# Patient Record
Sex: Female | Born: 1960 | Race: Black or African American | Hispanic: No | Marital: Single | Smoking: Never smoker
Health system: Southern US, Community
[De-identification: ages and names within clinical notes are randomized; demographics above are authoritative.]

## PROBLEM LIST (undated history)

## (undated) DIAGNOSIS — K529 Noninfective gastroenteritis and colitis, unspecified: Secondary | ICD-10-CM

## (undated) DIAGNOSIS — M5430 Sciatica, unspecified side: Secondary | ICD-10-CM

## (undated) DIAGNOSIS — M1990 Unspecified osteoarthritis, unspecified site: Secondary | ICD-10-CM

## (undated) DIAGNOSIS — M5030 Other cervical disc degeneration, unspecified cervical region: Secondary | ICD-10-CM

## (undated) DIAGNOSIS — R200 Anesthesia of skin: Secondary | ICD-10-CM

## (undated) DIAGNOSIS — F419 Anxiety disorder, unspecified: Secondary | ICD-10-CM

## (undated) DIAGNOSIS — T7840XA Allergy, unspecified, initial encounter: Secondary | ICD-10-CM

## (undated) HISTORY — DX: Unspecified osteoarthritis, unspecified site: M19.90

## (undated) HISTORY — DX: Anxiety disorder, unspecified: F41.9

## (undated) HISTORY — DX: Allergy, unspecified, initial encounter: T78.40XA

## (undated) HISTORY — DX: Anesthesia of skin: R20.0

## (undated) HISTORY — PX: ABDOMINAL HYSTERECTOMY: SHX81

---

## 2002-03-28 ENCOUNTER — Inpatient Hospital Stay (HOSPITAL_COMMUNITY): Admission: AD | Admit: 2002-03-28 | Discharge: 2002-03-28 | Payer: Self-pay | Admitting: *Deleted

## 2002-10-07 ENCOUNTER — Inpatient Hospital Stay (HOSPITAL_COMMUNITY): Admission: AD | Admit: 2002-10-07 | Discharge: 2002-10-07 | Payer: Self-pay | Admitting: *Deleted

## 2003-06-17 ENCOUNTER — Encounter (INDEPENDENT_AMBULATORY_CARE_PROVIDER_SITE_OTHER): Payer: Self-pay | Admitting: Specialist

## 2003-06-17 ENCOUNTER — Ambulatory Visit (HOSPITAL_COMMUNITY): Admission: RE | Admit: 2003-06-17 | Discharge: 2003-06-17 | Payer: Self-pay | Admitting: Obstetrics and Gynecology

## 2004-01-19 ENCOUNTER — Inpatient Hospital Stay (HOSPITAL_COMMUNITY): Admission: RE | Admit: 2004-01-19 | Discharge: 2004-01-21 | Payer: Self-pay | Admitting: Obstetrics and Gynecology

## 2004-01-19 ENCOUNTER — Encounter (INDEPENDENT_AMBULATORY_CARE_PROVIDER_SITE_OTHER): Payer: Self-pay | Admitting: Specialist

## 2005-04-24 ENCOUNTER — Ambulatory Visit (HOSPITAL_COMMUNITY): Admission: RE | Admit: 2005-04-24 | Discharge: 2005-04-24 | Payer: Self-pay | Admitting: Gastroenterology

## 2005-08-28 ENCOUNTER — Other Ambulatory Visit: Admission: RE | Admit: 2005-08-28 | Discharge: 2005-08-28 | Payer: Self-pay | Admitting: Obstetrics and Gynecology

## 2005-11-15 ENCOUNTER — Emergency Department (HOSPITAL_COMMUNITY): Admission: EM | Admit: 2005-11-15 | Discharge: 2005-11-15 | Payer: Self-pay | Admitting: Emergency Medicine

## 2010-05-25 ENCOUNTER — Encounter: Admission: RE | Admit: 2010-05-25 | Discharge: 2010-05-25 | Payer: Self-pay | Admitting: Gastroenterology

## 2010-12-11 ENCOUNTER — Encounter: Payer: Self-pay | Admitting: Gastroenterology

## 2010-12-11 ENCOUNTER — Encounter: Payer: Self-pay | Admitting: Obstetrics and Gynecology

## 2011-04-07 NOTE — Op Note (Signed)
   NAMEAnderson Reid NO.:  [id number]   MEDICAL RECORD NO.:  [id number]                   PATIENT TYPE:  AMB   LOCATION:  SDC                                  FACILITY:  [facility]   PHYSICIAN:  Duke Salvia. Marcelle Reid, M.D.            DATE OF BIRTH:  [birth year]   DATE OF PROCEDURE:  06/17/2003  DATE OF DISCHARGE:                                 OPERATIVE REPORT   PREOPERATIVE DIAGNOSIS:  Abnormal uterine bleeding, small leiomyoma, and  endometrial polyp on SHG.   POSTOPERATIVE DIAGNOSIS:  Abnormal uterine bleeding, small leiomyoma, and  endometrial polyp on SHG.   PROCEDURE:  Dilation and curettage, hysteroscopy.   SURGEON:  Duke Salvia. Marcelle Reid, M.D.   ANESTHESIA:  General.   PROCEDURE AND FINDINGS:  The patient was taken to the operating room.  After  an adequate level of general anesthetic was obtained with the patient's legs  in the stirrups, the perineum and vagina were prepped and draped with  Betadine.  The bladder was drained.  EUA was carried out.  The uterus was  eight weeks size, mid position, and mobile.  Adnexa negative.  A speculum  was positioned, cervix grasped with a tenaculum.  A paracervical block was  created by infiltrating at 3 and 9 o'clock 5-7 mL of 1% Xylocaine on either  side after negative aspiration.  This was done to help with postop cramping  pain.  After this was accomplished, the uterus was sounded to 9 cm, the  mucosa dilated to a 29 Pratt.  The 7 mm continuous flow hysteroscope was  then inserted.  A large amount of tissue including some apparent polypoid  tissue was noted.  D&C was carried out removing a moderate amount of tissue.  Polyp forceps were used in the cavity and two polyps were avulsed.  The  scope was reinserted.  Good view was noted at that point with some residual  tissue along the left cornua and left pelvic sidewall.  Repeat D&C removed  this tissue.  The cavity was irrigated, hysteroscoped again, and  noted to be  clear.  There were no other abnormalities noted, minimal bleeding.  She  tolerated this well and went to the recovery room in good condition.                                               Debra Reid, M.D.    RMH/MEDQ  D:  06/17/2003  T:  06/17/2003  Job:  [id number]

## 2011-04-07 NOTE — Discharge Summary (Signed)
Debra Reid, Debra Reid                         ACCOUNT NO.:  [id number]   MEDICAL RECORD NO.:  [id number]                   PATIENT TYPE:  INP   LOCATION:  9309                                 FACILITY:  [facility]   PHYSICIAN:  Duke Salvia. Marcelle Overlie, M.D.            DATE OF BIRTH:  1961/09/13   DATE OF ADMISSION:  01/19/2004  DATE OF DISCHARGE:  01/21/2004                                 DISCHARGE SUMMARY   DISCHARGE DIAGNOSES:  1. Chronic pelvic pain.  2. Leiomyoma.  3. Laparoscopy this admission followed by laparotomy with total abdominal     hysterectomy/bilateral salpingo-oophorectomy.   SUMMARY OF HISTORY AND PHYSICAL EXAMINATION:  Please see admission H&P for  details.  Briefly, a 50 year old, G1, P1, with known leiomyoma and  complaints of menorrhagia and pelvic pain, who presents for a definitive  hysterectomy.   HOSPITAL COURSE:  On January 19, 2004, under general anesthesia, the patient  underwent diagnostic laparoscopy that showed dense bilateral adnexal  adhesions, followed by TAH/BSO.  Some changes of chronic PID with bilateral  adnexal adhesions noted at the time of surgery.  On the first postoperative  day, her hemoglobin was 9.5.  She was afebrile with a WBC of 10.7, and her  diet was advanced.  By the second postoperative day, she was discharged at  1:30 p.m.  At that time, she was tolerating a regular diet, the incision was  clean and dry, her abdominal exam was unremarkable, and she was afebrile and  ready for discharge at that point.   LABORATORY DATA:  CMET on admission was normal.  Hemoglobin on admission was  12.1, WBC 4.6.  GPT was negative.  Blood type O positive.  Antibody screen  was negative.  UA was negative.  Postoperative CBC on January 20, 2004 was 9.5  with a WBC of 10.7.  Pathology report is pending.   FOLLOW UP:  She will return to the office in 3 days to have the clips  removed.   DISCHARGE INSTRUCTIONS:  1. Advised to report any incisional redness or  drainage, increased pain or     bleeding, or fever over 101.  2. She was given specific instructions regarding diet, sex, and exercise.   DISCHARGE MEDICATIONS:  1. The patient is discharged on Tylox p.r.n. pain.  2. Also will start on hemocyte once daily.   CONDITION:  Good.   ACTIVITY:  Increase as tolerated.                                               Richard M. Marcelle Overlie, M.D.   RMH/MEDQ  D:  01/21/2004  T:  01/22/2004  Job:  [id number]

## 2011-04-07 NOTE — Op Note (Signed)
Debra Reid, ABRAMOVICH          ACCOUNT NO.:  [id number]   MEDICAL RECORD NO.:  [id number]          PATIENT TYPE:  AMB   LOCATION:  ENDO                         FACILITY:  [facility]   PHYSICIAN:  Graylin Shiver, M.D.   DATE OF BIRTH:  Jan 13, 1961   DATE OF PROCEDURE:  04/24/2005  DATE OF DISCHARGE:                                 OPERATIVE REPORT   PROCEDURE:  Colonoscopy.   ENDOSCOPIST:  Graylin Shiver, M.D.   INDICATIONS:  Constipation and rectal bleeding.   Informed consent was obtained after explanation of the risks of bleeding,  infection, and perforation.   PREMEDICATION:  Fentanyl 75 mcg IV, Versed 8 mg IV.   DESCRIPTION OF PROCEDURE:  With the patient in the left lateral decubitus  position, a rectal exam was performed. No masses were felt. The Olympus  colonoscope was inserted into the rectum and advanced around the colon to  the cecum. Cecal landmarks were identified.  The cecum and ascending colon  were normal. The transverse colon was normal. The descending colon, sigmoid,  and rectum were normal. She tolerated the procedure well without  complications.   IMPRESSION:  Normal colonoscopy to cecum.   I am going to give this patient a therapeutic trial of Zelnorm 6 mg b.i.d.  for her problem pf constipation to see if this helps.      SFG/MEDQ  D:  04/24/2005  T:  04/24/2005  Job:  [id number]   cc:   Duke Salvia. Marcelle Overlie, M.D.  [address], [address]  [place] [address]  Fax: [phone]

## 2011-04-07 NOTE — Op Note (Signed)
NAMEAnderson Reid NO.:  [id number]   MEDICAL RECORD NO.:  [id number]                   PATIENT TYPE:   LOCATION:                                       FACILITY:   PHYSICIAN:  Duke Salvia. Marcelle Overlie, M.D.            DATE OF BIRTH:   DATE OF PROCEDURE:  01/19/2004  DATE OF DISCHARGE:                                 OPERATIVE REPORT   PREOPERATIVE DIAGNOSES:  1. Chronic pelvic pain.  2. Leiomyoma.   POSTOPERATIVE DIAGNOSES:  1. Chronic pelvic pain.  2. Leiomyoma.  3. Bilateral adnexal adhesions/changes of chronic pelvic inflammatory     disease.   PROCEDURE:  Diagnostic laparoscopy, followed by laparotomy with total  abdominal hysterectomy, bilateral salpingo-oophorectomy, lysis of adhesions.   SURGEON:  Duke Salvia. Marcelle Overlie, M.D.   ASSISTANT:  Dineen Kid. Rana Snare, M.D.   ANESTHESIA:  General endotracheal.   COMPLICATIONS:  None.   DRAINS:  Foley catheter.   ESTIMATED BLOOD LOSS:  350.   SPECIMENS REMOVED:  Uterus, bilateral tubes and ovaries.   PROCEDURE AND FINDINGS:  The patient went to the operating room.  After an  adequate level of general endotracheal anesthesia was obtained with the  patient's legs in stirrups, the abdomen, perineum, and vagina were prepped  and draped in the usual manner for laparoscopy.  The bladder was drained,  EUA carried out.  The uterus was eight weeks' size, midposition, mobile,  adnexa negative.  Hulka tenaculum was positioned.  Attention directed to the  abdomen, where a 2 cm subumbilical incision was made.  The Veress needle was  introduced without difficulty.  Its intra-abdominal position was verified by  pressure and water testing.  After a 2 L pneumoperitoneum was then created,  the laparoscopic trocar and sleeve were then introduced without difficulty.  Three fingerbreadths above the symphysis in the midline a 5 mm trocar was  inserted under direct visualization.  The patient then placed in  Trendelenburg  and the pelvic findings were as follows:  The uterus itself  was enlarged symmetrically to eight to 10 weeks' size.  There were dense  bilateral adnexal adhesions, making laparoscopic dissection very difficult.  A decision made to proceed with open laparotomy.  Instruments were removed,  gas allowed to escape.  The subumbilical incision was closed with a 4-0  subcuticular suture.  The patient's legs were placed supine after the Foley  catheter positioned.  A Pfannenstiel incision made two fingerbreadths above  the symphysis, carried down to the fascia, which was incised and extended  transversely.  Rectus muscles were divided in the midline, peritoneum  entered freely without incident and extended vertically.  The Lenox Ahr retractor was then positioned.  The upper abdominal findings were  unremarkable.  The appendix was noted to be unremarkable.  The bowels were  packed superiorly out of the field.  The patient was placed in slight  Trendelenburg.  Starting on the left, initially  the ovaries could not be  visualized well enough to free up, a decision made to proceed with the TAH,  followed by the BSO dissection separately.  The utero-ovarian pedicles were  then clamped, divided with the LigaSure.  The round ligament was divided on  that side also.  The peritoneum was carried around to the midline.  The  exact same repeated on the opposite side.  The uterine vasculature pedicles  were then skeletonized, clamped with LigaSure, cut, and divided.  The  bladder was then dissected below the cervicovaginal junction with sharp and  blunt dissection.  In sequential manner, the cardinal ligaments and  uterosacral ligaments were clamped, divided with the LigaSure system.  The  cervicovaginal pedicles were then clamped with curved Heaney clamps and the  specimen was excised.  These were sutured with 0 Vicryl in transfixation  fashion.  The cuff was closed with a running 2-0 Monocryl suture.   The cuff  area and all major pedicles were inspected and noted to be hemostatic.  Then  starting on the right, the retroperitoneal space on that side was developed.  The course of the ureter could easily be seen well below.  Keeping the  course of the ureter well below, the right IP ligament was clamped, divided,  and suture ligated with 0 Vicryl x1 with a free tie also.  Again assuring  that the course of the ureter was well below, the remainder of the right  tube and ovary were dissected free by clamp, cut, and suture technique.  The  ureter was retraced and was well below and was intact.  The exact technique  used on the opposite side, assuring that the ureter was well below to remove  the left tube and ovary without difficulty.  The pelvis was then irrigated  with saline, aspirated.  All major pedicles were inspected and noted to be  hemostatic.  Prior to closure the sponge, needle, and instrument counts were  reported as correct x2.  The peritoneum closed with a 2-0 Dexon suture,  interrupted 2-0 Dexon suture was used on the rectus muscles.  The AcuFuser  was placed one catheter subfascial and one subcutaneous.  The fascia closed  from laterally to midline with an 0 Vicryl suture.  The subcutaneous fat was  hemostatic, clips and Steri-Strips used on the skin, and the AcuFuser was  connected.  She tolerated this well and went to the recovery room in good  condition.                                               Richard M. Marcelle Overlie, M.D.    RMH/MEDQ  D:  01/19/2004  T:  01/19/2004  Job:  [id number]

## 2011-04-07 NOTE — H&P (Signed)
NAMEAnderson Reid NO.:  [id number]   MEDICAL RECORD NO.:  [id number]                   PATIENT TYPE:  AMB   LOCATION:  SDC                                  [facility]ILITY:  [facility]   PHYSICIAN:  Duke Salvia. Marcelle Overlie, M.D.            DATE OF BIRTH:  [birth year]   DATE OF ADMISSION:  06/17/2003  DATE OF DISCHARGE:                                HISTORY & PHYSICAL   CHIEF COMPLAINT:  Menorrhagia.   HISTORY OF PRESENT ILLNESS:  This is a 50 year old [place], Para 1  currently using condoms for contraception who was referred to me in June by  Dr. Salvadore Farber at [facility] for evaluation of menorrhagia.  He had  performed an ultrasound and treated her with OCP's, but she continued to  have problems with menorrhagia.  Review of the ultrasound performed at  [facility] did suggest the possibility of an endometrial polyp.   Evaluation in our office with sonohysterography revealed a 4.6 x 3.9 and a  1.2 x 1.2 fibroid and a small cyst on the right ovary. After saline infusion  there was a definite fundal polyp.  She presents now for [facility] hysteroscopy.  This procedure including the risks of bleeding, infection and other  complications such as perforation that may require additional surgery were  all reviewed with her which she understands and accepts.   ALLERGIES:  None.   PAST SURGICAL HISTORY:  None.   OBSTETRICAL HISTORY:  One vaginal delivery in 1981.   CURRENT MEDICATIONS:  None.   REVIEW OF SYMPTOMS:  GU:  Significant for a history of GC and trichomonas  treated in the past.  GENERAL:  History of crack cocaine use in the past.   FAMILY HISTORY:  Significant for a mother with ulcer disease and  hypertension, otherwise negative.   PHYSICAL EXAMINATION:  VITAL SIGNS:  Blood pressure 122/70, temperature  98.2.  HEENT:  Unremarkable.  The neck is supple without masses.  LUNGS:  Clear.  CARDIOVASCULAR EXAM:  Regular rate and rhythm without  murmurs, rubs or  gallops.  BREASTS:  Without masses.  ABDOMEN:  Soft, flat and non-tender.  PELVIC EXAM:  Normal external genitalia.  Vagina and cervix are clear.  Uterus itself was normal size and non-tender. Adnexa are negative.  EXTREMITIES:  Unremarkable.  NEUROLOGIC EXAM:  Unremarkable.    IMPRESSION:  1. Menorrhagia.  2. Ultrasound showing endometrial polyp and small fibroids.   PLAN:  Dilatation and curettage hysteroscopy.  The procedure and the risks  were reviewed as above.                                               Richard M. Marcelle Overlie, M.D.    RMH/MEDQ  D:  06/15/2003  T:  06/15/2003  Job:  [id number]  cc:   Delano Metz, M.D.  [address]  [place]  [place] [address]  Fax: [phone]

## 2012-03-11 ENCOUNTER — Ambulatory Visit (INDEPENDENT_AMBULATORY_CARE_PROVIDER_SITE_OTHER): Payer: Managed Care, Other (non HMO) | Admitting: Internal Medicine

## 2012-03-11 ENCOUNTER — Ambulatory Visit: Payer: Self-pay

## 2012-03-11 VITALS — BP 124/79 | HR 75 | Temp 98.3°F | Resp 18 | Ht 65.5 in | Wt 181.4 lb

## 2012-03-11 DIAGNOSIS — R509 Fever, unspecified: Secondary | ICD-10-CM

## 2012-03-11 DIAGNOSIS — J309 Allergic rhinitis, unspecified: Secondary | ICD-10-CM

## 2012-03-11 DIAGNOSIS — R079 Chest pain, unspecified: Secondary | ICD-10-CM

## 2012-03-11 DIAGNOSIS — R071 Chest pain on breathing: Secondary | ICD-10-CM

## 2012-03-11 DIAGNOSIS — R0789 Other chest pain: Secondary | ICD-10-CM

## 2012-03-11 LAB — POCT CBC
Granulocyte percent: 48.4 %G (ref 37–80)
HCT, POC: 42.1 % (ref 37.7–47.9)
Hemoglobin: 13.8 g/dL (ref 12.2–16.2)
Lymph, poc: 2.3 (ref 0.6–3.4)
MCH, POC: 30.3 pg (ref 27–31.2)
MCHC: 32.8 g/dL (ref 31.8–35.4)
MCV: 92.4 fL (ref 80–97)
MID (cbc): 0.3 (ref 0–0.9)
MPV: 8.5 fL (ref 0–99.8)
POC Granulocyte: 2.5 (ref 2–6.9)
POC LYMPH PERCENT: 45 %L (ref 10–50)
POC MID %: 6.6 %M (ref 0–12)
Platelet Count, POC: 273 10*3/uL (ref 142–424)
RBC: 4.56 M/uL (ref 4.04–5.48)
RDW, POC: 13.3 %
WBC: 5.1 10*3/uL (ref 4.6–10.2)

## 2012-03-11 LAB — COMPREHENSIVE METABOLIC PANEL
ALT: 20 U/L (ref 0–35)
AST: 25 U/L (ref 0–37)
Albumin: 4.2 g/dL (ref 3.5–5.2)
Alkaline Phosphatase: 60 U/L (ref 39–117)
BUN: 15 mg/dL (ref 6–23)
CO2: 28 mEq/L (ref 19–32)
Calcium: 9.1 mg/dL (ref 8.4–10.5)
Chloride: 106 mEq/L (ref 96–112)
Creat: 0.72 mg/dL (ref 0.50–1.10)
Glucose, Bld: 83 mg/dL (ref 70–99)
Potassium: 4.6 mEq/L (ref 3.5–5.3)
Sodium: 140 mEq/L (ref 135–145)
Total Bilirubin: 0.5 mg/dL (ref 0.3–1.2)
Total Protein: 6.6 g/dL (ref 6.0–8.3)

## 2012-03-11 MED ORDER — MELOXICAM 15 MG PO TABS: 15.0000 mg | ORAL_TABLET | Freq: Every day | ORAL | Status: DC

## 2012-03-11 MED ORDER — FLUTICASONE PROPIONATE 50 MCG/ACT NA SUSP: 2.0000 | Freq: Every day | NASAL | Status: DC

## 2012-03-11 MED ORDER — CETIRIZINE HCL 10 MG PO TABS: 10.0000 mg | ORAL_TABLET | Freq: Every day | ORAL | Status: DC

## 2012-03-11 NOTE — Progress Notes (Signed)
  Subjective:    Patient ID: Debra Reid, female    DOB: [birth year], 51 y.o.   MRN: [id number]  HPIComplaining of left anterior chest wall pain getting worse over the past week Denies palpitations or shortness of breath Pain runs from under her left breast into the middle of the back and is sporadic No pain with deep breathing or twisting No nausea or diaphoresis About 10 days ago she had chills fever and night sweats for 48 hours with a cough that was nonproductive Further past week she has had rhinitis with sneezing and itching in eyes No history of allergies in the past No history of asthma    Review of Systems No fatigue/no history of hypertension or heart disease/no exercise associated chest pain/no edema/notes bloating after meals frequentlyBut no heartburn or nocturnal pain No diarrhea or constipation Several years ago had gallbladder ultrasound and was told that the gallbladder was very small    Objective:   Physical ExamIn no acute distress Vital signs stable TMs clear/nares boggy/eyes injected/throat clear/no a.c. Nodes/no thyromegaly Chest clear to auscultation Heart regular without murmurs rubs clicks or gallops Abdomen soft nontender nondistended with no organomegaly The liver edge feels firm No peripheral edema  EKG is normal    [facility] reading (PRIMARY) by  Dr.Ryanna Teschner=No active disease Results for orders placed in visit on 03/11/12  POCT CBC      Component Value Range   WBC 5.1  4.6 - 10.2 (K/uL)   Lymph, poc 2.3  0.6 - 3.4    POC LYMPH PERCENT 45.0  10 - 50 (%L)   MID (cbc) 0.3  0 - 0.9    POC MID % 6.6  0 - 12 (%M)   POC Granulocyte 2.5  2 - 6.9    Granulocyte percent 48.4  37 - 80 (%G)   RBC 4.56  4.04 - 5.48 (M/uL)   Hemoglobin 13.8  12.2 - 16.2 (g/dL)   HCT, POC 40.9  81.1 - 47.9 (%)   MCV 92.4  80 - 97 (fL)   MCH, POC 30.3  27 - 31.2 (pg)   MCHC 32.8  31.8 - 35.4 (g/dL)   RDW, POC 91.4     Platelet Count, POC 273  142 - 424 (K/uL)   MPV  8.5  0 - 99.8 (fL)      Assessment & Plan:  Problem #1 recent viral pneumonia Problem #2 chest wall pain Problem #3 allergic rhinitis Problem #4 postprandial bloating  Meds ordered this encounter  Medications  . Estradiol (DIVIGEL) 1 MG/GM GEL    Sig: Place onto the skin.  . meloxicam (MOBIC) 15 MG tablet    Sig: Take 1 tablet (15 mg total) by mouth daily. For chest wall pain    Dispense:  15 tablet    Refill:  0  . cetirizine (ZYRTEC) 10 MG tablet    Sig: Take 1 tablet (10 mg total) by mouth daily.    Dispense:  30 tablet    Refill:  11  . fluticasone (FLONASE) 50 MCG/ACT nasal spray    Sig: Place 2 sprays into the nose daily.    Dispense:  16 g    Refill:  6  Except I didn't order estradiol/Epic added it Metabolic profile ordered to check for liver functions Followup one month if no well

## 2012-03-14 ENCOUNTER — Encounter: Payer: Self-pay | Admitting: Internal Medicine

## 2012-03-19 ENCOUNTER — Encounter: Payer: Self-pay | Admitting: Internal Medicine

## 2012-09-01 ENCOUNTER — Emergency Department (HOSPITAL_COMMUNITY): Payer: Managed Care, Other (non HMO)

## 2012-09-01 ENCOUNTER — Emergency Department (HOSPITAL_COMMUNITY)
Admission: EM | Admit: 2012-09-01 | Discharge: 2012-09-01 | Disposition: A | Discharge status: Home / Self Care | Payer: Managed Care, Other (non HMO) | Attending: Emergency Medicine | Admitting: Emergency Medicine

## 2012-09-01 ENCOUNTER — Encounter (HOSPITAL_COMMUNITY): Payer: Self-pay | Admitting: Emergency Medicine

## 2012-09-01 DIAGNOSIS — M5431 Sciatica, right side: Secondary | ICD-10-CM

## 2012-09-01 DIAGNOSIS — M129 Arthropathy, unspecified: Secondary | ICD-10-CM | POA: Insufficient documentation

## 2012-09-01 DIAGNOSIS — M5430 Sciatica, unspecified side: Secondary | ICD-10-CM | POA: Insufficient documentation

## 2012-09-01 DIAGNOSIS — E876 Hypokalemia: Secondary | ICD-10-CM | POA: Insufficient documentation

## 2012-09-01 DIAGNOSIS — R110 Nausea: Secondary | ICD-10-CM | POA: Insufficient documentation

## 2012-09-01 DIAGNOSIS — R197 Diarrhea, unspecified: Secondary | ICD-10-CM | POA: Insufficient documentation

## 2012-09-01 DIAGNOSIS — K5289 Other specified noninfective gastroenteritis and colitis: Secondary | ICD-10-CM | POA: Insufficient documentation

## 2012-09-01 DIAGNOSIS — K529 Noninfective gastroenteritis and colitis, unspecified: Secondary | ICD-10-CM

## 2012-09-01 LAB — COMPREHENSIVE METABOLIC PANEL
ALT: 14 U/L (ref 0–35)
AST: 21 U/L (ref 0–37)
Albumin: 3.2 g/dL — ABNORMAL LOW (ref 3.5–5.2)
Alkaline Phosphatase: 74 U/L (ref 39–117)
BUN: 9 mg/dL (ref 6–23)
CO2: 22 mEq/L (ref 19–32)
Calcium: 8.8 mg/dL (ref 8.4–10.5)
Chloride: 101 mEq/L (ref 96–112)
Creatinine, Ser: 0.73 mg/dL (ref 0.50–1.10)
GFR calc Af Amer: >90 mL/min (ref >90)
GFR calc non Af Amer: >90 mL/min (ref >90)
Glucose, Bld: 77 mg/dL (ref 70–99)
Potassium: 3.1 mEq/L — ABNORMAL LOW (ref 3.5–5.1)
Sodium: 135 mEq/L (ref 135–145)
Total Bilirubin: 0.4 mg/dL (ref 0.3–1.2)
Total Protein: 6.6 g/dL (ref 6.0–8.3)

## 2012-09-01 LAB — CBC WITH DIFFERENTIAL/PLATELET
Basophils Absolute: 0 10*3/uL (ref 0.0–0.1)
Basophils Relative: 0 % (ref 0–1)
Eosinophils Absolute: 0.2 10*3/uL (ref 0.0–0.7)
Eosinophils Relative: 2 % (ref 0–5)
HCT: 38.3 % (ref 36.0–46.0)
Hemoglobin: 13.7 g/dL (ref 12.0–15.0)
Lymphocytes Relative: 12 % (ref 12–46)
Lymphs Abs: 1.2 10*3/uL (ref 0.7–4.0)
MCH: 31.4 pg (ref 26.0–34.0)
MCHC: 35.8 g/dL (ref 30.0–36.0)
MCV: 87.8 fL (ref 78.0–100.0)
Monocytes Absolute: 0.7 10*3/uL (ref 0.1–1.0)
Monocytes Relative: 7 % (ref 3–12)
Neutro Abs: 7.9 10*3/uL — ABNORMAL HIGH (ref 1.7–7.7)
Neutrophils Relative %: 79 % — ABNORMAL HIGH (ref 43–77)
Platelets: 220 10*3/uL (ref 150–400)
RBC: 4.36 MIL/uL (ref 3.87–5.11)
RDW: 12.5 % (ref 11.5–15.5)
WBC: 10 10*3/uL (ref 4.0–10.5)

## 2012-09-01 LAB — LIPASE, BLOOD: Lipase: 21 U/L (ref 11–59)

## 2012-09-01 MED ORDER — POTASSIUM CHLORIDE CRYS ER 20 MEQ PO TBCR: 40.0000 meq | EXTENDED_RELEASE_TABLET | Freq: Once | ORAL | Status: DC

## 2012-09-01 MED ORDER — CIPROFLOXACIN HCL 500 MG PO TABS: 500.0000 mg | ORAL_TABLET | Freq: Two times a day (BID) | ORAL | Status: DC

## 2012-09-01 MED ORDER — ONDANSETRON HCL 4 MG/2ML IJ SOLN
4.0000 mg | Freq: Once | INTRAMUSCULAR | Status: AC
  Administered 2012-09-01: 4 mg via INTRAVENOUS
  Filled 2012-09-01: qty 2

## 2012-09-01 MED ORDER — METRONIDAZOLE 500 MG PO TABS
500.0000 mg | ORAL_TABLET | Freq: Once | ORAL | Status: AC
  Administered 2012-09-01: 500 mg via ORAL
  Filled 2012-09-01: qty 1

## 2012-09-01 MED ORDER — HYDROCODONE-ACETAMINOPHEN 5-500 MG PO TABS: 1.0000 | ORAL_TABLET | Freq: Four times a day (QID) | ORAL | Status: DC | PRN

## 2012-09-01 MED ORDER — METRONIDAZOLE 500 MG PO TABS: 500.0000 mg | ORAL_TABLET | Freq: Two times a day (BID) | ORAL | Status: DC

## 2012-09-01 MED ORDER — DICYCLOMINE HCL 10 MG/ML IM SOLN
20.0000 mg | Freq: Once | INTRAMUSCULAR | Status: AC
  Administered 2012-09-01: 20 mg via INTRAMUSCULAR
  Filled 2012-09-01: qty 4

## 2012-09-01 MED ORDER — CIPROFLOXACIN HCL 500 MG PO TABS
500.0000 mg | ORAL_TABLET | Freq: Once | ORAL | Status: AC
  Administered 2012-09-01: 500 mg via ORAL
  Filled 2012-09-01: qty 1

## 2012-09-01 MED ORDER — MORPHINE SULFATE 4 MG/ML IJ SOLN
4.0000 mg | Freq: Once | INTRAMUSCULAR | Status: AC
  Administered 2012-09-01: 4 mg via INTRAVENOUS
  Filled 2012-09-01: qty 1

## 2012-09-01 MED ORDER — SODIUM CHLORIDE 0.9 % IV SOLN
1000.0000 mL | Freq: Once | INTRAVENOUS | Status: AC
  Administered 2012-09-01: 1000 mL via INTRAVENOUS

## 2012-09-01 MED ORDER — IOHEXOL 300 MG/ML  SOLN
100.0000 mL | Freq: Once | INTRAMUSCULAR | Status: AC | PRN
  Administered 2012-09-01: 100 mL via INTRAVENOUS

## 2012-09-01 MED ORDER — POTASSIUM CHLORIDE CRYS ER 20 MEQ PO TBCR
40.0000 meq | EXTENDED_RELEASE_TABLET | Freq: Once | ORAL | Status: AC
  Administered 2012-09-01: 40 meq via ORAL
  Filled 2012-09-01: qty 2

## 2012-09-01 NOTE — ED Notes (Signed)
Patient states that she is still unable to use the restroom at this time 

## 2012-09-01 NOTE — ED Notes (Signed)
Patient states that she is unable to use the restroom at this time. She will use call bell when she feels the need to go.

## 2012-09-01 NOTE — ED Notes (Signed)
Pt states 3 days ago started having diarrhea and cramping in her upper abdomen, denies nausea or vomiting, states she does feel like something is in her throat, like reflux. Pt states diarrhea worsened yesterday, denies diarrhea today states just having abdominal cramping. Pt also states she has a knot on her R thigh and at times has a sharp pain shoot down her R leg from hip to foot.

## 2012-09-01 NOTE — ED Notes (Signed)
Pt presenting to ed with c/o abdominal pain and cramping with diarrhea x 3 days. Pt states positive nausea no diarrhea. Pt states she also has right leg pain she states "I have a knot in my right leg but I don't know how long it's been there" pt states right leg pain is from her hip all the way down her leg. Pt ambulating in triage without difficulty.

## 2012-09-01 NOTE — ED Provider Notes (Signed)
History     CSN: [id number]  Arrival date & time 09/01/12  0930   First MD Initiated Contact with Patient 09/01/12 1003      Chief Complaint  Patient presents with  . Abdominal Pain  . Leg Pain    (Consider location/radiation/quality/duration/timing/severity/associated sxs/prior treatment) Patient is a 51 y.o. female presenting with abdominal pain.  Abdominal Pain The primary symptoms of the illness include abdominal pain, nausea and diarrhea. The primary symptoms of the illness do not include fever, vomiting, hematemesis or dysuria.  Additional symptoms associated with the illness include back pain. Symptoms associated with the illness do not include chills.  PT states she has had diffuse abdominal cramping for about 3 days, states symptoms worsened yesterday. States also having persistent watery diarrhea. States nausea, no vomiting. Did not measure temperature.  No hx of the same. No recent ill contacts. No travel or recent surgeries/hospital stay.  LEG PAIN Pt also reports history of intermittent right buttock leg pain for several months. States pain goes down right thigh. Pain comes and goes. States there is a "knot in the thigh" that is non tender, and does not hurt. Denies leg swelling. No numbness or weakness to the distal leg.   History reviewed. No pertinent past medical history.  Past Surgical History  Procedure Date  . Abdominal hysterectomy     No family history on file.  History  Substance Use Topics  . Smoking status: Never Smoker   . Smokeless tobacco: Never Used  . Alcohol Use: No    OB History    Grav Para Term Preterm Abortions TAB SAB Ect Mult Living                  Review of Systems  Constitutional: Negative for fever and chills.  HENT: Negative for neck pain and neck stiffness.   Respiratory: Negative.   Cardiovascular: Negative.   Gastrointestinal: Positive for nausea, abdominal pain and diarrhea. Negative for vomiting and hematemesis.    Genitourinary: Negative for dysuria.  Musculoskeletal: Positive for back pain and arthralgias.  Skin: Negative.   Neurological: Negative for dizziness, weakness and numbness.  Hematological: Negative.     Allergies  Review of patient's allergies indicates no known allergies.  Home Medications   Current Outpatient Rx  Name Route Sig Dispense Refill  . ESTRADIOL 1 MG/GM TD GEL Transdermal Place 1 application onto the skin daily.       BP 118/72  Pulse 76  Temp 98.5 F (36.9 C) (Oral)  Resp 20  SpO2 97%  Physical Exam  Nursing note and vitals reviewed. Constitutional: She is oriented to person, place, and time. She appears well-developed and well-nourished. No distress.  Eyes: Conjunctivae normal are normal.  Neck: Neck supple.  Cardiovascular: Normal rate, regular rhythm and normal heart sounds.   Pulmonary/Chest: Effort normal and breath sounds normal. No respiratory distress. She has no wheezes. She has no rales.  Abdominal: Soft. Bowel sounds are normal. She exhibits no distension. There is no rebound.       Diffuse tenderness, no guarding, no rebound tenderness  Neurological: She is alert and oriented to person, place, and time.  Skin: Skin is warm and dry.    ED Course  Procedures (including critical care time)  10:32 AM Pt seen and examined. Pt with diffuse abdominal pain, cramping, diarrhea. DDx includes viral gastroenteritis, colitis, obstruction. Will get labs, x-ray. Will try bentyl.  Filed Vitals:   09/01/12 0938  BP: 118/72  Pulse: 76  Temp: 98.5 F (36.9 C)  Resp: 20   Results for orders placed during the hospital encounter of 09/01/12  CBC WITH DIFFERENTIAL      Component Value Range   WBC 10.0  4.0 - 10.5 K/uL   RBC 4.36  3.87 - 5.11 MIL/uL   Hemoglobin 13.7  12.0 - 15.0 g/dL   HCT 16.1  09.6 - 04.5 %   MCV 87.8  78.0 - 100.0 fL   MCH 31.4  26.0 - 34.0 pg   MCHC 35.8  30.0 - 36.0 g/dL   RDW 40.9  81.1 - 91.4 %   Platelets 220  150 - 400 K/uL    Neutrophils Relative 79 (*) 43 - 77 %   Neutro Abs 7.9 (*) 1.7 - 7.7 K/uL   Lymphocytes Relative 12  12 - 46 %   Lymphs Abs 1.2  0.7 - 4.0 K/uL   Monocytes Relative 7  3 - 12 %   Monocytes Absolute 0.7  0.1 - 1.0 K/uL   Eosinophils Relative 2  0 - 5 %   Eosinophils Absolute 0.2  0.0 - 0.7 K/uL   Basophils Relative 0  0 - 1 %   Basophils Absolute 0.0  0.0 - 0.1 K/uL  COMPREHENSIVE METABOLIC PANEL      Component Value Range   Sodium 135  135 - 145 mEq/L   Potassium 3.1 (*) 3.5 - 5.1 mEq/L   Chloride 101  96 - 112 mEq/L   CO2 22  19 - 32 mEq/L   Glucose, Bld 77  70 - 99 mg/dL   BUN 9  6 - 23 mg/dL   Creatinine, Ser 7.82  0.50 - 1.10 mg/dL   Calcium 8.8  8.4 - 95.6 mg/dL   Total Protein 6.6  6.0 - 8.3 g/dL   Albumin 3.2 (*) 3.5 - 5.2 g/dL   AST 21  0 - 37 U/L   ALT 14  0 - 35 U/L   Alkaline Phosphatase 74  39 - 117 U/L   Total Bilirubin 0.4  0.3 - 1.2 mg/dL   GFR calc non Af Amer >90  >90 mL/min   GFR calc Af Amer >90  >90 mL/min  LIPASE, BLOOD      Component Value Range   Lipase 21  11 - 59 U/L   Ct Abdomen Pelvis W Contrast  09/01/2012  *RADIOLOGY REPORT*  Clinical Data: Diarrhea and cramping.  Abdominal pain.  CT ABDOMEN AND PELVIS WITH CONTRAST  Technique:  Multidetector CT imaging of the abdomen and pelvis was performed following the standard protocol during bolus administration of intravenous contrast.  Contrast: OMNIPAQUE IOHEXOL 300 MG/ML  SOLN  Comparison: 09/01/2012 radiographs.  Findings: Lung Bases: Dependent atelectasis at the lung bases.  Liver:  Normal.  Spleen:  Normal.  Gallbladder:  Normal.  Common bile duct:  Normal.  Pancreas:  Normal.  Adrenal glands:  Normal.  Kidneys:  Normal enhancement and excretion of contrast.  Stomach:  Distended with oral contrast.  Normal.  Small bowel:  Normal.  No obstruction.  Colon:   Normal appendix.  There is mild diffuse colonic mural thickening, best visualized in the transverse colon but affecting the entire colon.  There  is no definite rectal wall thickening.  No adenopathy.  Pelvic Genitourinary:  Hysterectomy.  Small amount of free fluid in the anatomic pelvis is likely physiologic.  Urinary bladder normal.  Bones:  No aggressive osseous lesions.  Sacralization of both L5 transverse processes.  Vasculature:  Normal.  IMPRESSION: 1.  Mild colitis is diffuse, extending from cecum to the sigmoid colon.  Although nonspecific, this is most commonly infectious or associated with inflammatory bowel disease.  Ischemia considered unlikely based on lack of atherosclerosis.  Visualized vasculature adequately patent. 2.  Hysterectomy.   Original Report Authenticated By: Andreas Newport, M.D.    Dg Abd Acute W/chest  09/01/2012  *RADIOLOGY REPORT*  Clinical Data: Abdominal pain.  Short of breath.  Cough.  Nausea. Diarrhea.  ACUTE ABDOMEN SERIES (ABDOMEN 2 VIEW & CHEST 1 VIEW)  Comparison: None.  Findings: No evidence of dilated bowel loops.  No evidence of free intraperitoneal air.  No radiopaque calculi identified.  Heart size and mediastinal contours are normal.  Both lungs are clear.  IMPRESSION: No acute findings.   Original Report Authenticated By: Danae Orleans, M.D.     PT with mild colitis, diffuse. Pt is non toxic. Normal VS, ithink this pt is appropriate for outpatient therapy. Will start on cipro, flagyl, follow up with GI. Pt comfortable with the plan. She is tolerating POs in ED, first dose of antibiotics given here.  Filed Vitals:   09/01/12 1539  BP: 118/67  Pulse: 63  Temp: 97.8 F (36.6 C)  Resp: 18     1. Colitis   2. Right sided sciatica   3. Hypokalemia       MDM  See above        Lottie Mussel, PA 09/01/12 2056

## 2012-09-01 NOTE — ED Notes (Signed)
Patient transported to CT 

## 2012-09-03 ENCOUNTER — Ambulatory Visit (INDEPENDENT_AMBULATORY_CARE_PROVIDER_SITE_OTHER): Payer: Managed Care, Other (non HMO) | Admitting: Family Medicine

## 2012-09-03 ENCOUNTER — Telehealth: Payer: Self-pay | Admitting: Radiology

## 2012-09-03 VITALS — BP 118/80 | HR 57 | Temp 98.0°F | Resp 16 | Ht 66.0 in | Wt 184.0 lb

## 2012-09-03 DIAGNOSIS — K529 Noninfective gastroenteritis and colitis, unspecified: Secondary | ICD-10-CM

## 2012-09-03 DIAGNOSIS — R51 Headache: Secondary | ICD-10-CM

## 2012-09-03 MED ORDER — KETOPROFEN 50 MG PO CAPS: 50.0000 mg | ORAL_CAPSULE | Freq: Four times a day (QID) | ORAL | Status: DC | PRN

## 2012-09-03 MED ORDER — KETOPROFEN 50 MG PO CAPS: 50.0000 mg | ORAL_CAPSULE | Freq: Three times a day (TID) | ORAL | Status: DC | PRN

## 2012-09-03 NOTE — Progress Notes (Signed)
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  She was nauseated, lightheaded, and had headaches without vomiting.  She went to [employer] on Sunday morning and was started on cipro, flagyl and vicodin after a CT scan showed "colitis."  She was given saline and potassium at the ED.   Recently took amoxicillin for root canal just before the "colitis" began.  Since Sunday the diarrhea has subsided and headache is persisting with some lightheadedness.  Objective:  NAD Abdomen: mild distension of abdomen with some borborygmi, no HSM or tenderness Chest:  Clear Heart:  Regular, no gallop  Assessment:  Gastroenteritis.  Headache.  Plan:  Return 10/22 Only 5 days of abx. Start culturelle

## 2012-09-03 NOTE — Patient Instructions (Signed)
No dairy products for 3 days Stop the antibiotics after Thursday Start culturelle daily (probiotic)

## 2012-09-03 NOTE — ED Provider Notes (Signed)
Medical screening examination/treatment/procedure(s) were performed by non-physician practitioner and as supervising physician I was immediately available for consultation/collaboration.  Adilynn Bessey T Gwyn Mehring, MD 09/03/12 1531 

## 2012-09-03 NOTE — Telephone Encounter (Signed)
meds called in per Dr Milus Glazier, Burna Mortimer

## 2012-10-11 ENCOUNTER — Encounter (HOSPITAL_COMMUNITY): Payer: Self-pay | Admitting: Emergency Medicine

## 2012-10-11 ENCOUNTER — Emergency Department (HOSPITAL_COMMUNITY): Payer: Managed Care, Other (non HMO)

## 2012-10-11 ENCOUNTER — Emergency Department (HOSPITAL_COMMUNITY)
Admission: EM | Admit: 2012-10-11 | Discharge: 2012-10-11 | Disposition: A | Discharge status: Home / Self Care | Payer: Managed Care, Other (non HMO) | Attending: Emergency Medicine | Admitting: Emergency Medicine

## 2012-10-11 DIAGNOSIS — R109 Unspecified abdominal pain: Secondary | ICD-10-CM | POA: Insufficient documentation

## 2012-10-11 DIAGNOSIS — R5383 Other fatigue: Secondary | ICD-10-CM

## 2012-10-11 DIAGNOSIS — Z8719 Personal history of other diseases of the digestive system: Secondary | ICD-10-CM | POA: Insufficient documentation

## 2012-10-11 DIAGNOSIS — R51 Headache: Secondary | ICD-10-CM | POA: Insufficient documentation

## 2012-10-11 DIAGNOSIS — R5381 Other malaise: Secondary | ICD-10-CM | POA: Insufficient documentation

## 2012-10-11 DIAGNOSIS — IMO0001 Reserved for inherently not codable concepts without codable children: Secondary | ICD-10-CM | POA: Insufficient documentation

## 2012-10-11 DIAGNOSIS — Z79899 Other long term (current) drug therapy: Secondary | ICD-10-CM | POA: Insufficient documentation

## 2012-10-11 HISTORY — DX: Noninfective gastroenteritis and colitis, unspecified: K52.9

## 2012-10-11 LAB — CBC
HCT: 39.9 % (ref 36.0–46.0)
MCH: 31.4 pg (ref 26.0–34.0)
MCV: 88.9 fL (ref 78.0–100.0)
RDW: 12.5 % (ref 11.5–15.5)
WBC: 3.5 10*3/uL — ABNORMAL LOW (ref 4.0–10.5)

## 2012-10-11 LAB — URINALYSIS, ROUTINE W REFLEX MICROSCOPIC
Bilirubin Urine: NEGATIVE
Glucose, UA: NEGATIVE mg/dL
Ketones, ur: NEGATIVE mg/dL
Protein, ur: NEGATIVE mg/dL
pH: 8 (ref 5.0–8.0)

## 2012-10-11 LAB — BASIC METABOLIC PANEL
BUN: 16 mg/dL (ref 6–23)
Calcium: 9.1 mg/dL (ref 8.4–10.5)
Chloride: 101 mEq/L (ref 96–112)
Creatinine, Ser: 0.74 mg/dL (ref 0.50–1.10)
GFR calc Af Amer: >90 mL/min (ref >90)

## 2012-10-11 LAB — HEPATIC FUNCTION PANEL
ALT: 21 U/L (ref 0–35)
AST: 30 U/L (ref 0–37)
Bilirubin, Direct: <0.1 mg/dL (ref 0.0–0.3)
Total Bilirubin: 0.5 mg/dL (ref 0.3–1.2)

## 2012-10-11 MED ORDER — KETOROLAC TROMETHAMINE 30 MG/ML IJ SOLN
30.0000 mg | Freq: Once | INTRAMUSCULAR | Status: AC
  Administered 2012-10-11: 30 mg via INTRAVENOUS
  Filled 2012-10-11: qty 1

## 2012-10-11 MED ORDER — IOHEXOL 300 MG/ML  SOLN
100.0000 mL | Freq: Once | INTRAMUSCULAR | Status: AC | PRN
  Administered 2012-10-11: 100 mL via INTRAVENOUS

## 2012-10-11 MED ORDER — METOCLOPRAMIDE HCL 5 MG/ML IJ SOLN
10.0000 mg | Freq: Once | INTRAMUSCULAR | Status: AC
  Administered 2012-10-11: 10 mg via INTRAVENOUS
  Filled 2012-10-11: qty 2

## 2012-10-11 MED ORDER — SODIUM CHLORIDE 0.9 % IV BOLUS (SEPSIS)
1500.0000 mL | INTRAVENOUS | Status: AC
  Administered 2012-10-11: 1500 mL via INTRAVENOUS

## 2012-10-11 NOTE — ED Notes (Signed)
Pt states she was diagnosed with colitis in October, states she has continued to have abd pain since. Pt states she is also having constant headaches and has become extremely fatigued over the past several weeks.  Pt denies N/V/D.

## 2012-10-11 NOTE — ED Notes (Signed)
Pt back from CT

## 2012-10-11 NOTE — ED Notes (Signed)
Pt alert and oriented x4. Respirations even and unlabored, bilateral symmetrical rise and fall of chest. Skin warm and dry. In no acute distress. Denies needs.   

## 2012-10-11 NOTE — ED Provider Notes (Signed)
History     CSN: [id number]  Arrival date & time 10/11/12  7829   First MD Initiated Contact with Patient 10/11/12 [phone]      Chief Complaint  Patient presents with  . Abdominal Pain  . Fatigue    (Consider location/radiation/quality/duration/timing/severity/associated sxs/prior treatment) HPI Comments: [name] presents for evaluation of fatigue and persistent abdominal pain.  She was seen in the ER and diagnosed with a mild colitis in October.  She completed the antibiotic course for 7 days and was seen in follow-up by her PMD and a local GI specialist.  She state it did improve after the antibiotic but her PMD stopped the prescribed 10 day course at 7 days.  She has had progressively worsening nonspecific fatigue and reports near daily headaches.  She has not had any recurrent diarrhea and feels as if she may be constipated.  She denies urinary complaints and vaginal bleeding.  She has had no fevers, weight loss, and states her appetite has been normal.  She denies feeling depressed.  Patient is a 51 y.o. female presenting with abdominal pain. The history is provided by the patient. No language interpreter was used.  Abdominal Pain The primary symptoms of the illness include abdominal pain and fatigue. The primary symptoms of the illness do not include fever, shortness of breath, nausea, vomiting, diarrhea, hematemesis, hematochezia, dysuria, vaginal discharge or vaginal bleeding. The current episode started more than 2 days ago. The onset of the illness was gradual. The problem has been gradually worsening.  The patient states that she believes she is currently not pregnant. The patient has not had a change in bowel habit. Symptoms associated with the illness do not include chills, anorexia, diaphoresis, heartburn, constipation, urgency, hematuria, frequency or back pain.    Past Medical History  Diagnosis Date  . Colitis     dx 08/2012    Past Surgical History  Procedure Date    . Abdominal hysterectomy     History reviewed. No pertinent family history.  History  Substance Use Topics  . Smoking status: Never Smoker   . Smokeless tobacco: Never Used  . Alcohol Use: No    OB History    Grav Para Term Preterm Abortions TAB SAB Ect Mult Living                  Review of Systems  Constitutional: Positive for fatigue. Negative for fever, chills, diaphoresis, activity change and appetite change.  HENT: Negative for congestion, sore throat, facial swelling, rhinorrhea, trouble swallowing, neck pain, neck stiffness and postnasal drip.   Eyes: Negative.   Respiratory: Negative for cough, choking and shortness of breath.   Cardiovascular: Negative for chest pain, palpitations and leg swelling.  Gastrointestinal: Positive for abdominal pain. Negative for heartburn, nausea, vomiting, diarrhea, constipation, blood in stool, hematochezia, anorexia and hematemesis.  Genitourinary: Negative for dysuria, urgency, frequency, hematuria, flank pain, vaginal bleeding, vaginal discharge, difficulty urinating, vaginal pain, pelvic pain and dyspareunia.  Musculoskeletal: Positive for myalgias (only at site of IM injection on right thigh). Negative for back pain, joint swelling and gait problem.  Skin: Negative.   Neurological: Positive for weakness and light-headedness. Negative for dizziness, tremors, seizures, syncope, speech difficulty and numbness.  Hematological: Negative for adenopathy. Does not bruise/bleed easily.  Psychiatric/Behavioral: Negative for confusion and dysphoric mood. The patient is not nervous/anxious.     Allergies  Review of patient's allergies indicates no known allergies.  Home Medications   Current Outpatient Rx  Name  Route  Sig  Dispense  Refill  . ESTRADIOL 1 MG/GM TD GEL   Transdermal   Place 1 application onto the skin daily.          Marland Kitchen OVER THE COUNTER MEDICATION      herbalife calcium/multivitamin supplement           BP  113/88  Pulse 82  Temp 98.8 F (37.1 C) (Oral)  Resp 20  SpO2 98%  Physical Exam  Nursing note and vitals reviewed. Constitutional: She is oriented to person, place, and time. She appears well-developed and well-nourished. No distress.  HENT:  Head: Normocephalic and atraumatic.  Right Ear: External ear normal.  Left Ear: External ear normal.  Nose: Nose normal.  Mouth/Throat: Oropharynx is clear and moist. No oropharyngeal exudate.  Eyes: Conjunctivae normal are normal. Pupils are equal, round, and reactive to light. Right eye exhibits no discharge. Left eye exhibits no discharge. No scleral icterus.  Neck: Normal range of motion. Neck supple. No JVD present. No tracheal deviation present.  Cardiovascular: Normal rate, regular rhythm, normal heart sounds and intact distal pulses.  Exam reveals no gallop and no friction rub.   No murmur heard. Pulmonary/Chest: Effort normal and breath sounds normal. No stridor. No respiratory distress. She has no wheezes. She has no rales. She exhibits no tenderness.  Abdominal: Soft. Bowel sounds are normal. She exhibits no shifting dullness, no distension, no pulsatile liver, no abdominal bruit, no ascites, no pulsatile midline mass and no mass. There is no hepatosplenomegaly. There is tenderness (diffuse lower abd) in the right lower quadrant and left lower quadrant. There is positive Murphy's sign. There is no rigidity, no rebound, no guarding, no CVA tenderness and no tenderness at McBurney's point. No hernia.  Musculoskeletal: Normal range of motion. She exhibits no edema and no tenderness.  Lymphadenopathy:    She has no cervical adenopathy.  Neurological: She is alert and oriented to person, place, and time. No cranial nerve deficit.  Skin: Skin is warm and dry. No rash noted. She is not diaphoretic. No erythema. No pallor.  Psychiatric: She has a normal mood and affect. Her behavior is normal.    ED Course  Procedures (including critical care  time)   Labs Reviewed  GLUCOSE, CAPILLARY  CBC  BASIC METABOLIC PANEL  URINALYSIS, ROUTINE W REFLEX MICROSCOPIC  HEPATIC FUNCTION PANEL   No results found.   No diagnosis found.    MDM  Pt presents for evaluation of fatigue and persistent abdominal pain.  She was seen in the ER and diagnosed with a mild colitis in October.  She completed the antibiotic course for 7 days and was seen in follow-up by her PMD and a local GI specialist.  She appears nontoxic, note decrease in BP and increase in HR on orthostatic VS, NAD.  She has reproducible lower abd tenderness.  Plan basic labs, symptomatic mgmnt, IVF - may repeat CT of the ABD.  1440.  Pt stable, NAD.  Pain is improved.  HA is resolved.  Colitis seen on previous CT scan is resolved.  Note nl belly labs.  Plan discharge home to follow-up closely with her GI specialist.       Tobin Chad, MD 10/11/12 1505

## 2012-10-11 NOTE — ED Notes (Addendum)
Pt reports headache x several weeks. Constant Left sided pain 7/10, reports intermittent dizzines. Denies blurry vision. Hand grips and leg pushes bil equal. Reports mother has diabetes

## 2012-10-11 NOTE — ED Notes (Signed)
md at bedside

## 2012-10-11 NOTE — ED Notes (Signed)
Pt to CT

## 2013-03-21 ENCOUNTER — Other Ambulatory Visit: Payer: Self-pay | Admitting: Obstetrics and Gynecology

## 2013-03-21 DIAGNOSIS — R928 Other abnormal and inconclusive findings on diagnostic imaging of breast: Secondary | ICD-10-CM

## 2013-04-07 ENCOUNTER — Ambulatory Visit
Admission: RE | Admit: 2013-04-07 | Discharge: 2013-04-07 | Disposition: A | Discharge status: Home / Self Care | Payer: Managed Care, Other (non HMO) | Source: Ambulatory Visit | Attending: Obstetrics and Gynecology | Admitting: Obstetrics and Gynecology

## 2013-04-07 DIAGNOSIS — R928 Other abnormal and inconclusive findings on diagnostic imaging of breast: Secondary | ICD-10-CM

## 2013-06-23 ENCOUNTER — Ambulatory Visit (INDEPENDENT_AMBULATORY_CARE_PROVIDER_SITE_OTHER): Payer: Managed Care, Other (non HMO) | Admitting: Physician Assistant

## 2013-06-23 VITALS — BP 122/80 | HR 55 | Temp 97.9°F | Resp 16 | Ht 65.25 in | Wt 183.8 lb

## 2013-06-23 DIAGNOSIS — Z23 Encounter for immunization: Secondary | ICD-10-CM

## 2013-06-23 NOTE — Progress Notes (Signed)
   [address], [place] [address]   Phone [phone]  Subjective:    Patient ID: Debra Reid, female    DOB: 1960-12-04, 52 y.o.   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  She is not sure which one she need and her paperwork is not clear.  She does not believe she has gotten a vaccine for tetanus in many years.    Review of Systems     Objective:   Physical Exam  Vitals reviewed. Constitutional: She is oriented to person, place, and time. She appears well-developed and well-nourished.  HENT:  Head: Normocephalic and atraumatic.  Right Ear: External ear normal.  Left Ear: External ear normal.  Eyes: Conjunctivae are normal.  Neck: Normal range of motion.  Pulmonary/Chest: Effort normal.  Neurological: She is alert and oriented to person, place, and time.  Skin: Skin is warm and dry.  Psychiatric: She has a normal mood and affect. Her behavior is normal. Judgment and thought content normal.       Assessment & Plan:  Need for prophylactic vaccination with measles-mumps-rubella (MMR) vaccine - Plan: Tdap vaccine greater than or equal to 7yo IM  Need for prophylactic vaccination with combined diphtheria-tetanus-pertussis (DTP) vaccine - Plan: MMR vaccine subcutaneous  I tried to call the school immunization contact but there was no one available for me to speak with.  Benny Lennert PA-C 06/23/2013 11:40 AM

## 2014-02-20 ENCOUNTER — Ambulatory Visit: Payer: Managed Care, Other (non HMO)

## 2014-02-20 ENCOUNTER — Ambulatory Visit (INDEPENDENT_AMBULATORY_CARE_PROVIDER_SITE_OTHER): Payer: Managed Care, Other (non HMO) | Admitting: Internal Medicine

## 2014-02-20 ENCOUNTER — Encounter (HOSPITAL_COMMUNITY): Payer: Self-pay

## 2014-02-20 ENCOUNTER — Ambulatory Visit (HOSPITAL_COMMUNITY)
Admission: RE | Admit: 2014-02-20 | Discharge: 2014-02-20 | Disposition: A | Discharge status: Home / Self Care | Payer: Managed Care, Other (non HMO) | Source: Ambulatory Visit | Attending: Internal Medicine | Admitting: Internal Medicine

## 2014-02-20 VITALS — BP 134/100 | HR 74 | Temp 97.5°F | Resp 16 | Ht 66.0 in | Wt 195.0 lb

## 2014-02-20 DIAGNOSIS — R1031 Right lower quadrant pain: Secondary | ICD-10-CM | POA: Insufficient documentation

## 2014-02-20 DIAGNOSIS — M542 Cervicalgia: Secondary | ICD-10-CM

## 2014-02-20 DIAGNOSIS — R1032 Left lower quadrant pain: Secondary | ICD-10-CM

## 2014-02-20 DIAGNOSIS — R0981 Nasal congestion: Secondary | ICD-10-CM

## 2014-02-20 LAB — POCT UA - MICROSCOPIC ONLY
Bacteria, U Microscopic: NEGATIVE
CASTS, UR, LPF, POC: NEGATIVE
CRYSTALS, UR, HPF, POC: NEGATIVE
Mucus, UA: NEGATIVE
RBC, urine, microscopic: 0
Yeast, UA: NEGATIVE

## 2014-02-20 LAB — POCT CBC
Granulocyte percent: 64.5 %G (ref 37–80)
HEMATOCRIT: 48.3 % — AB (ref 37.7–47.9)
Hemoglobin: 15.6 g/dL (ref 12.2–16.2)
LYMPH, POC: 1.5 (ref 0.6–3.4)
MCH: 30.9 pg (ref 27–31.2)
MCHC: 32.3 g/dL (ref 31.8–35.4)
MCV: 95.6 fL (ref 80–97)
MID (CBC): 0.3 (ref 0–0.9)
MPV: 8.9 fL (ref 0–99.8)
PLATELET COUNT, POC: 268 10*3/uL (ref 142–424)
POC Granulocyte: 3.3 (ref 2–6.9)
POC LYMPH %: 29.6 % (ref 10–50)
POC MID %: 5.9 %M (ref 0–12)
RBC: 5.05 M/uL (ref 4.04–5.48)
RDW, POC: 13.4 %
WBC: 5.1 10*3/uL (ref 4.6–10.2)

## 2014-02-20 LAB — POCT URINALYSIS DIPSTICK
Bilirubin, UA: NEGATIVE
Blood, UA: NEGATIVE
Glucose, UA: NEGATIVE
Ketones, UA: NEGATIVE
LEUKOCYTES UA: NEGATIVE
Nitrite, UA: NEGATIVE
PH UA: 7.5
PROTEIN UA: 30
Spec Grav, UA: 1.02
UROBILINOGEN UA: 0.2

## 2014-02-20 LAB — COMPREHENSIVE METABOLIC PANEL
ALK PHOS: 76 U/L (ref 39–117)
ALT: 21 U/L (ref 0–35)
AST: 26 U/L (ref 0–37)
Albumin: 4.3 g/dL (ref 3.5–5.2)
BILIRUBIN TOTAL: 0.4 mg/dL (ref 0.2–1.2)
BUN: 19 mg/dL (ref 6–23)
CO2: 27 meq/L (ref 19–32)
CREATININE: 0.76 mg/dL (ref 0.50–1.10)
Calcium: 9.8 mg/dL (ref 8.4–10.5)
Chloride: 100 mEq/L (ref 96–112)
GLUCOSE: 81 mg/dL (ref 70–99)
Potassium: 4.5 mEq/L (ref 3.5–5.3)
SODIUM: 137 meq/L (ref 135–145)
TOTAL PROTEIN: 7.1 g/dL (ref 6.0–8.3)

## 2014-02-20 MED ORDER — KETOROLAC TROMETHAMINE 60 MG/2ML IM SOLN
60.0000 mg | Freq: Once | INTRAMUSCULAR | Status: AC
  Administered 2014-02-20: 60 mg via INTRAMUSCULAR

## 2014-02-20 MED ORDER — DICYCLOMINE HCL 20 MG PO TABS: 20.0000 mg | ORAL_TABLET | Freq: Three times a day (TID) | ORAL | Status: DC

## 2014-02-20 MED ORDER — IOHEXOL 300 MG/ML  SOLN
50.0000 mL | Freq: Once | INTRAMUSCULAR | Status: AC | PRN
  Administered 2014-02-20: 50 mL via ORAL

## 2014-02-20 MED ORDER — IOHEXOL 300 MG/ML  SOLN
100.0000 mL | Freq: Once | INTRAMUSCULAR | Status: AC | PRN
  Administered 2014-02-20: 100 mL via INTRAVENOUS

## 2014-02-20 NOTE — Addendum Note (Signed)
Addended by: Tonye PearsonOLITTLE, Syd Manges P on: 02/20/2014 07:11 PM   Modules accepted: Orders

## 2014-02-20 NOTE — Patient Instructions (Signed)
[facility] and register at Emergency Department for OUTPATIENT CT. DO NOT REGISTER AS ED PATIENT.

## 2014-02-20 NOTE — Progress Notes (Addendum)
Subjective:    Patient ID: Debra Reid, female    DOB: 1961-11-09, 53 y.o.   MRN: [id number] This chart was scribed for Debra Sia, MD by Nicholos Johns, Medical Scribe. This patient's care was started at 3:09 PM.   Abdominal Pain Associated symptoms include headaches.  Headache  Associated symptoms include abdominal pain.   HPI Comments: [name] is a 53 y.o. female who presents to the [facility] complaining of abdominal pain, onset yesterday. Pain worse with movment. States pain is making her feel like she had to have a BM but cannot once she tries to. Also states pain makes her have acid reflux but no vomiting. Has not really been able to eat normally since onset. Has never had abdominal surgery. No fever. No chills. Last p.m. and 8 AM was soft without blood. Has not been able to eat or have BM since even that she feels the need. No recent constipation or diarrhea. History of colitis of some sort proven by colonoscopy but then has had a colonoscopy since then which was normal and has had not had any symptoms for several years.  Also reports HA present for 2 weeks. States pain is stiffening and at one point she was unable to turn her neck due to the pain. Works at [employer] and used their on site doctor who told her she needed to come in and be seen because it is not related sinuses as she thought. Denies trouble swallowing  Past Surgical History  Procedure Laterality Date   Abdominal hysterectomy--total      Review of Systems  Gastrointestinal: Positive for abdominal pain.  Neurological: Positive for headaches.   Objective:  Physical Exam  Vitals reviewed. Constitutional: She is oriented to person, place, and time. She appears well-developed and well-nourished. She appears distressed.  [name] and holding belly in pain  HENT:  Head: Normocephalic and atraumatic.  Nose: Nose normal.  Eyes: Conjunctivae and EOM are normal. Pupils are  equal, round, and reactive to light.  Neck: Neck supple. No thyromegaly present.  Pain with rotation to the left. Tenderness in right posterior neck.  Cardiovascular: Normal rate.   Pulmonary/Chest: Effort normal and breath sounds normal. No respiratory distress. She has no wheezes.  Abdominal: Soft. She exhibits distension. There is tenderness. There is rebound.  No bowel sounds. Tender to palpation over RM & RLQ with  rebound tenderness, guarding,and percussion tenderness. Rates pain as unbearable!!!  Musculoskeletal: Normal range of motion. She exhibits no edema.  Lymphadenopathy:    She has no cervical adenopathy.  Neurological: She is alert and oriented to person, place, and time.  Skin: Skin is warm and dry.  Psychiatric: She has a normal mood and affect. Her behavior is normal.   [facility] reading (PRIMARY) by  Dr. Merla Riches chest is clear/the abdomen has ?collection of air fluid levels in the right lower quadrant   Results for orders placed in visit on 02/20/14  POCT CBC      Result Value Ref Range   WBC 5.1  4.6 - 10.2 K/uL   Lymph, poc 1.5  0.6 - 3.4   POC LYMPH PERCENT 29.6  10 - 50 %L   MID (cbc) 0.3  0 - 0.9   POC MID % 5.9  0 - 12 %M   POC Granulocyte 3.3  2 - 6.9   Granulocyte percent 64.5  37 - 80 %G   RBC 5.05  4.04 - 5.48 M/uL   Hemoglobin  15.6  12.2 - 16.2 g/dL   HCT, POC 16.148.3 (*) 09.637.7 - 47.9 %   MCV 95.6  80 - 97 fL   MCH, POC 30.9  27 - 31.2 pg   MCHC 32.3  31.8 - 35.4 g/dL   RDW, POC 04.513.4     Platelet Count, POC 268  142 - 424 K/uL   MPV 8.9  0 - 99.8 fL  POCT UA - MICROSCOPIC ONLY      Result Value Ref Range   WBC, Ur, HPF, POC 0-1     RBC, urine, microscopic 0     Bacteria, U Microscopic neg     Mucus, UA neg     Epithelial cells, urine per micros 0-2     Crystals, Ur, HPF, POC neg     Casts, Ur, LPF, POC neg     Yeast, UA neg    POCT URINALYSIS DIPSTICK      Result Value Ref Range   Color, UA yellow     Clarity, UA clear     Glucose, UA neg      Bilirubin, UA neg     Ketones, UA neg     Spec Grav, UA 1.020     Blood, UA neg     pH, UA 7.5     Protein, UA 30     Urobilinogen, UA 0.2     Nitrite, UA neg     Leukocytes, UA Negative      [id number]  Assessment & Plan:  I have completed the patient encounter in its entirety as documented by the scribe, with editing by me where necessary. Robert P. Merla Richesoolittle, M.D. RLQ abdominal pain - Plan: CT Abdomen Pelvis W Wo Contrast,  RMQ abdominal pain -  Need to r/o acute obstruction(prior TAH)--or terminal ileitis--(hx colitis in remission) Neck pain  Muscle etiology-defer treatment until above is satisfied Sinus congestion    [facility]--she prefers no ambulance and family will take her

## 2014-06-22 ENCOUNTER — Ambulatory Visit (INDEPENDENT_AMBULATORY_CARE_PROVIDER_SITE_OTHER): Payer: Managed Care, Other (non HMO) | Admitting: Emergency Medicine

## 2014-06-22 VITALS — BP 120/70 | HR 75 | Temp 98.1°F | Resp 16 | Ht 66.0 in | Wt 189.2 lb

## 2014-06-22 DIAGNOSIS — R5383 Other fatigue: Secondary | ICD-10-CM

## 2014-06-22 DIAGNOSIS — R202 Paresthesia of skin: Principal | ICD-10-CM

## 2014-06-22 DIAGNOSIS — R5381 Other malaise: Secondary | ICD-10-CM

## 2014-06-22 DIAGNOSIS — R200 Anesthesia of skin: Secondary | ICD-10-CM

## 2014-06-22 DIAGNOSIS — R209 Unspecified disturbances of skin sensation: Secondary | ICD-10-CM

## 2014-06-22 DIAGNOSIS — G4700 Insomnia, unspecified: Secondary | ICD-10-CM

## 2014-06-22 LAB — GLUCOSE, POCT (MANUAL RESULT ENTRY): POC Glucose: 83 mg/dl (ref 70–99)

## 2014-06-22 LAB — POCT CBC
Granulocyte percent: 53.3 %G (ref 37–80)
HCT, POC: 45 % (ref 37.7–47.9)
HEMOGLOBIN: 14.9 g/dL (ref 12.2–16.2)
LYMPH, POC: 1.8 (ref 0.6–3.4)
MCH: 31.2 pg (ref 27–31.2)
MCHC: 33.1 g/dL (ref 31.8–35.4)
MCV: 94 fL (ref 80–97)
MID (CBC): 0.3 (ref 0–0.9)
MPV: 7.7 fL (ref 0–99.8)
PLATELET COUNT, POC: 236 10*3/uL (ref 142–424)
POC GRANULOCYTE: 2.5 (ref 2–6.9)
POC LYMPH PERCENT: 39.6 %L (ref 10–50)
POC MID %: 7.1 % (ref 0–12)
RBC: 4.79 M/uL (ref 4.04–5.48)
RDW, POC: 13.1 %
WBC: 4.6 10*3/uL (ref 4.6–10.2)

## 2014-06-22 NOTE — Patient Instructions (Signed)

## 2014-06-22 NOTE — Progress Notes (Signed)
Subjective:   This chart was scribed for Lesle ChrisSteven Kelsey Edman, MD by Andrew Auaven Small, ED Scribe. This patient was seen in room 5 and the patient's care was started at 12:11 PM.   Patient ID: Debra Reid, female    DOB: 06/30/1961, 53 y.o.   MRN: [id number]  Dizziness Associated symptoms include numbness and weakness. Pertinent negatives include no chills or fever.   Chief Complaint  Patient presents with  . Numbness    x1 week;  pt indicates she has numbness in both hands/legs/feet.  pt states she tried elevating but no relief  . Fatigue    x1 week; pt states she has been very tired and has trouble sleeping at night  . Dizziness    pt indicates she has been dizzy since the numbness started   HPI Comments: Debra Reid is a 53 y.o. female who presents to the [facility] complaining of numbness in legs and feet x 1 week. Pt report she was at work when she was stung by an unknown insect in left arms.  She denies seeing the insect but report the stinger was left in her arm after being stung.  She reports associated fatigue and weakness to lower extremities. She denies fever. She denies loss of bowels. She denies bowel and bladder incontinence. She denies allergies. She denies medical problems.    Pt reports she does not sleep well during the night. She reports waking up in the middle of the night feeling fully rested. She denies snoring. Pt denies stop breathing while sleeping.    Past Medical History  Diagnosis Date  . Colitis     dx 08/2012  . Allergy   . Arthritis    Past Surgical History  Procedure Laterality Date  . Abdominal hysterectomy     Prior to Admission medications   Medication Sig Start Date End Date Taking? Authorizing Provider  OVER THE COUNTER MEDICATION herbalife calcium/multivitamin supplement   Yes Historical Provider, MD  dicyclomine (BENTYL) 20 MG tablet Take 1 tablet (20 mg total) by mouth 4 (four) times daily -  before meals and at  bedtime. For abdominal pain 02/20/14   Tonye Pearsonobert P Doolittle, MD  Estradiol (DIVIGEL) 1 MG/GM GEL Place 1 application onto the skin daily.     Historical Provider, MD   Review of Systems  Constitutional: Negative for fever and chills.  Gastrointestinal: Negative for diarrhea, constipation and blood in stool.  Genitourinary: Negative for dysuria, urgency, frequency, decreased urine volume, enuresis and difficulty urinating.  Neurological: Positive for weakness and numbness.  Psychiatric/Behavioral: Positive for sleep disturbance.    Objective:   Physical Exam CONSTITUTIONAL: Well developed/well nourished HEAD: Normocephalic/atraumatic EYES: EOMI/PERRL ENMT: Mucous membranes moist NECK: supple no meningeal signs SPINE:entire spine nontender CV: S1/S2 noted, no murmurs/rubs/gallops noted LUNGS: Lungs are clear to auscultation bilaterally, no apparent distress ABDOMEN: soft, nontender, no rebound or guarding GU:no cva tenderness NEURO: Pt is awake/alert, moves all extremitiesx4, fine motor testing is normal. Strength is 5/5, LE reflexes 1+ bilaterally. EXTREMITIES: pulses normal, full ROM SKIN: warm, color normal PSYCH: no abnormalities of mood noted  Results for orders placed in visit on 06/22/14  POCT CBC      Result Value Ref Range   WBC 4.6  4.6 - 10.2 K/uL   Lymph, poc 1.8  0.6 - 3.4   POC LYMPH PERCENT 39.6  10 - 50 %L   MID (cbc) 0.3  0 - 0.9   POC MID % 7.1  0 - 12 %M   POC Granulocyte 2.5  2 - 6.9   Granulocyte percent 53.3  37 - 80 %G   RBC 4.79  4.04 - 5.48 M/uL   Hemoglobin 14.9  12.2 - 16.2 g/dL   HCT, POC 16.1  09.6 - 47.9 %   MCV 94.0  80 - 97 fL   MCH, POC 31.2  27 - 31.2 pg   MCHC 33.1  31.8 - 35.4 g/dL   RDW, POC 04.5     Platelet Count, POC 236  142 - 424 K/uL   MPV 7.7  0 - 99.8 fL  GLUCOSE, POCT (MANUAL RESULT ENTRY)      Result Value Ref Range   POC Glucose 83  70 - 99 mg/dl   Assessment & Plan:  Patient enters with the chief complaint of fatigue and  numbness of her lower extremities. Routine blood work was done. I could not elicit anything focal abnormalities on her neurological examination. She had no bowel bladder symptoms suggestive of Guillain-Barr she also complains of fatigue and is not sleeping well I personally performed the services described in this documentation, which was scribed in my presence. The recorded information has been reviewed and is accurate.

## 2014-06-23 ENCOUNTER — Encounter: Payer: Self-pay | Admitting: Family Medicine

## 2014-06-23 ENCOUNTER — Encounter: Payer: Self-pay | Admitting: *Deleted

## 2014-06-23 LAB — COMPLETE METABOLIC PANEL WITH GFR
ALT: 18 U/L (ref 0–35)
AST: 24 U/L (ref 0–37)
Albumin: 4.1 g/dL (ref 3.5–5.2)
Alkaline Phosphatase: 68 U/L (ref 39–117)
BUN: 13 mg/dL (ref 6–23)
CO2: 21 mEq/L (ref 19–32)
Calcium: 9.3 mg/dL (ref 8.4–10.5)
Chloride: 103 mEq/L (ref 96–112)
Creat: 0.75 mg/dL (ref 0.50–1.10)
GFR, Est African American: >89 mL/min
GFR, Est Non African American: >89 mL/min
Glucose, Bld: 81 mg/dL (ref 70–99)
Potassium: 4.2 mEq/L (ref 3.5–5.3)
Sodium: 137 mEq/L (ref 135–145)
TOTAL PROTEIN: 6.6 g/dL (ref 6.0–8.3)
Total Bilirubin: 0.4 mg/dL (ref 0.2–1.2)

## 2014-06-23 LAB — TSH: TSH: 0.615 u[IU]/mL (ref 0.350–4.500)

## 2014-06-23 LAB — RPR

## 2014-06-23 LAB — HIV ANTIBODY (ROUTINE TESTING W REFLEX): HIV 1&2 Ab, 4th Generation: NONREACTIVE

## 2014-07-22 ENCOUNTER — Ambulatory Visit (INDEPENDENT_AMBULATORY_CARE_PROVIDER_SITE_OTHER): Payer: Managed Care, Other (non HMO) | Admitting: *Deleted

## 2014-07-22 ENCOUNTER — Encounter: Payer: Self-pay | Admitting: Neurology

## 2014-07-22 VITALS — BP 115/76 | HR 64 | Ht 66.0 in | Wt 191.4 lb

## 2014-07-22 DIAGNOSIS — T148 Other injury of unspecified body region: Secondary | ICD-10-CM

## 2014-07-22 DIAGNOSIS — R202 Paresthesia of skin: Secondary | ICD-10-CM | POA: Insufficient documentation

## 2014-07-22 DIAGNOSIS — R209 Unspecified disturbances of skin sensation: Secondary | ICD-10-CM

## 2014-07-22 DIAGNOSIS — W57XXXA Bitten or stung by nonvenomous insect and other nonvenomous arthropods, initial encounter: Secondary | ICD-10-CM

## 2014-07-22 NOTE — Patient Instructions (Signed)
I had a long discussion with the patient regarding her symptoms of extremity paresthesias which seem to be improving and fatigue. I am not sure whether these are related to the insect bite that she had. We will check for Lyme's disease, ANA, ESR, B12 and TSH. We will hold off on further testing with EMG and MRI scan of the cervical spine at the present time. If she does not completely recover or has neurological worsening may consider doing those tests sooner. Return for followup in 2 months or call earlier if necessary.  Paresthesia Paresthesia is an abnormal burning or prickling sensation. This sensation is generally felt in the hands, arms, legs, or feet. However, it may occur in any part of the body. It is usually not painful. The feeling may be described as:  Tingling or numbness.  "Pins and needles."  Skin crawling.  Buzzing.  Limbs "falling asleep."  Itching. Most people experience temporary (transient) paresthesia at some time in their lives. CAUSES  Paresthesia may occur when you breathe too quickly (hyperventilation). It can also occur without any apparent cause. Commonly, paresthesia occurs when pressure is placed on a nerve. The feeling quickly goes away once the pressure is removed. For some people, however, paresthesia is a long-lasting (chronic) condition caused by an underlying disorder. The underlying disorder may be:  A traumatic, direct injury to nerves. Examples include a:  Broken (fractured) neck.  Fractured skull.  A disorder affecting the brain and spinal cord (central nervous system). Examples include:  Transverse myelitis.  Encephalitis.  Transient ischemic attack.  Multiple sclerosis.  Stroke.  Tumor or blood vessel problems, such as an arteriovenous malformation pressing against the brain or spinal cord.  A condition that damages the peripheral nerves (peripheral neuropathy). Peripheral nerves are not part of the brain and spinal cord. These  conditions include:  Diabetes.  Peripheral vascular disease.  Nerve entrapment syndromes, such as carpal tunnel syndrome.  Shingles.  Hypothyroidism.  Vitamin B12 deficiencies.  Alcoholism.  Heavy metal poisoning (lead, arsenic).  Rheumatoid arthritis.  Systemic lupus erythematosus. DIAGNOSIS  Your caregiver will attempt to find the underlying cause of your paresthesia. Your caregiver may:  Take your medical history.  Perform a physical exam.  Order various lab tests.  Order imaging tests. TREATMENT  Treatment for paresthesia depends on the underlying cause. HOME CARE INSTRUCTIONS  Avoid drinking alcohol.  You may consider massage or acupuncture to help relieve your symptoms.  Keep all follow-up appointments as directed by your caregiver. SEEK IMMEDIATE MEDICAL CARE IF:   You feel weak.  You have trouble walking or moving.  You have problems with speech or vision.  You feel confused.  You cannot control your bladder or bowel movements.  You feel numbness after an injury.  You faint.  Your burning or prickling feeling gets worse when walking.  You have pain, cramps, or dizziness.  You develop a rash. MAKE SURE YOU:  Understand these instructions.  Will watch your condition.  Will get help right away if you are not doing well or get worse. Document Released: 10/27/2002 Document Revised: 01/29/2012 Document Reviewed: 07/28/2011 [facility] Patient Information 2015 [place], [place]. This information is not intended to replace advice given to you by your health care provider. Make sure you discuss any questions you have with your health care provider.

## 2014-07-22 NOTE — Progress Notes (Signed)
[facility] [address] [place]. [place] [address] [phone]       OFFICE CONSULT NOTE  [name] Date of Birth:  October 15, 1961 Medical Record Number:  [id number]   Referring MD: Arlyss Queen  Reason for Referral:   tingling HPI: [name] is a 35 year African American lady who for the last 2-3 weeks has had tingling and numbness involving her hands and feet. She is unsure as to when exactly this began but feels that this began after she had an insect bite in the left forearm while at work. She even had this tender in her forearm and had to remove it. She denied significant itching or rash surrounding this. The healing and numbness involving both hands from the wrist down and occasionally spread up to the elbow. And also involve both legs from the knee down. This was an annoying sensation. She also noticed that she had excessive fatigue and would tire easily. Her hands would hurt. She did not have significant weakness, trouble with walking or balance or any falls. She denied significant neck pain or pain shooting down her arm or legs. She has had chronic bladder urgency following hysterectomy but denied any recent changes. She has no history of loss of vision, blurred vision, diplopia, vertigo, ataxia gait or balance problems. There is no history of migraine headaches, seizures, strokes, TIAs or significant loss of consciousness. She feels that in the last few days tingling and numbness sensation has improved significantly though she still has it a little bit. She did have some lab work drawn at urgent care on 06/22/14 which included CBC and basic without metabolic panel labs which were all normal H IV antibodies were nonreactive. RPR was negative. TSH was normal at 0.61. Liver function tests were normal.  ROS:   14 system review of systems is positive for fatigue, ringing in the ears, itching, cough, and feeling hot, joint pain, aching muscles, runny nose, numbness,  anxiety, not enough sleep, decreased energy, racing thoughts, sleepiness and insomnia.  PMH:  Past Medical History  Diagnosis Date  . Colitis     dx 08/2012  . Allergy   . Arthritis   . Numbness     both feet and hands  . Anxiety     Social History:  History   Social History  . Marital Status: Single    Spouse Name: N/A    Number of Children: 1  . Years of Education: college   Occupational History  . [employer]    Social History Main Topics  . Smoking status: Never Smoker   . Smokeless tobacco: Never Used  . Alcohol Use: No  . Drug Use: No  . Sexual Activity: No   Other Topics Concern  . Not on file   Social History Narrative  . No narrative on file    Medications:   Current Outpatient Prescriptions on File Prior to Visit  Medication Sig Dispense Refill  . Estradiol (DIVIGEL) 1 MG/GM GEL Place 1 application onto the skin daily.       Marland Kitchen OVER THE COUNTER MEDICATION herbalife calcium/multivitamin supplement       No current facility-administered medications on file prior to visit.    Allergies:  No Known Allergies  Physical Exam General: well developed, well nourished middle aged african American lady not in distress, seated, in no evident distress Head: head normocephalic and atraumatic. Orohparynx benign Neck: supple with no carotid or supraclavicular bruits Cardiovascular: regular rate and rhythm, no  murmurs Musculoskeletal: no deformity Skin:  no rash/petichiae Vascular:  Normal pulses all extremities Filed Vitals:   07/22/14 0859  BP: 115/76  Pulse: 64    Neurologic Exam Mental Status: Awake and fully alert. Oriented to place and time. Recent and remote memory intact. Attention span, concentration and fund of knowledge appropriate. Mood and affect appropriate.  Cranial Nerves: Fundoscopic exam reveals sharp disc margins. Pupils equal, briskly reactive to light. Extraocular movements full without nystagmus. Visual fields full to confrontation. Hearing  intact. Facial sensation intact. Face, tongue, palate moves normally and symmetrically.  Motor: Normal bulk and tone. Normal strength in all tested extremity muscles. Sensory.: intact to tough and pinprick and vibratory.  Coordination: Rapid alternating movements normal in all extremities. Finger-to-nose and heel-to-shin performed accurately bilaterally. Gait and Station: Arises from chair without difficulty. Stance is normal. Gait demonstrates normal stride length and balance . Able to heel, toe and tandem walk without difficulty.  Reflexes: 1+ and symmetric. Toes downgoing.       ASSESSMENT: 14 year African American lady with 2-3 week history of bilateral hand and feet paresthesias of unclear etiology which seem to be improving. Neurological exam is unremarkable. Possible etiologies include postinfectious demyelinating, hormonal,  vasculitic or autoimmune etiology.She had an insect bite in the left forearm prior to the onset of these symptoms and  relationship is unclear.    PLAN: I had a long discussion with the patient regarding her symptoms of extremity paresthesias which seem to be improving and fatigue. I am not sure whether these are related to the insect bite that she had. We will check for Lyme's disease, ANA, ESR, B12 and TSH. We will hold off on further testing with EMG and MRI scan of the cervical spine at the present time. If she does not completely recover or has neurological worsening may consider doing those tests sooner. Return for followup in 2 months or call earlier if necessary.   Note: This document was prepared with digital dictation and possible smart phrase technology. Any transcriptional errors that result from this process are unintentional.

## 2014-07-23 NOTE — Progress Notes (Signed)
Quick Note:  I called and LMVM cell that her labs were ok, pending lyme disease. Will call when this is resulted to [place]. She is to call back if questions. ______

## 2014-07-30 LAB — TSH: TSH: 1.54 u[IU]/mL (ref 0.450–4.500)

## 2014-07-30 LAB — LYME, WESTERN BLOT, SERUM (REFLEXED)
IGG P28 AB.: ABSENT
IGG P30 AB.: ABSENT
IGG P39 AB.: ABSENT
IGG P45 AB.: ABSENT
IgG P23 Ab.: ABSENT
IgG P58 Ab.: ABSENT
IgG P93 Ab.: ABSENT
IgM P23 Ab.: ABSENT
IgM P39 Ab.: ABSENT
LYME IGG WB: NEGATIVE
LYME IGM WB: NEGATIVE

## 2014-07-30 LAB — LYME, TOTAL AB TEST/REFLEX: LYME IGG/IGM AB: 0.97 {ISR} — AB (ref 0.00–0.90)

## 2014-07-30 LAB — ANA: Anti Nuclear Antibody(ANA): NEGATIVE

## 2014-07-30 LAB — SEDIMENTATION RATE: SED RATE: 6 mm/h (ref 0–40)

## 2014-07-30 LAB — VITAMIN B12: Vitamin B-12: 1845 pg/mL — ABNORMAL HIGH (ref 211–946)

## 2014-09-28 ENCOUNTER — Ambulatory Visit: Payer: Managed Care, Other (non HMO) | Admitting: Neurology

## 2014-10-24 ENCOUNTER — Ambulatory Visit (INDEPENDENT_AMBULATORY_CARE_PROVIDER_SITE_OTHER): Payer: Managed Care, Other (non HMO) | Admitting: Family Medicine

## 2014-10-24 VITALS — BP 122/82 | HR 74 | Temp 97.8°F | Resp 18 | Ht 66.5 in | Wt 191.6 lb

## 2014-10-24 DIAGNOSIS — M25522 Pain in left elbow: Secondary | ICD-10-CM

## 2014-10-24 DIAGNOSIS — R079 Chest pain, unspecified: Secondary | ICD-10-CM

## 2014-10-24 DIAGNOSIS — R05 Cough: Secondary | ICD-10-CM

## 2014-10-24 DIAGNOSIS — J309 Allergic rhinitis, unspecified: Secondary | ICD-10-CM

## 2014-10-24 DIAGNOSIS — R059 Cough, unspecified: Secondary | ICD-10-CM

## 2014-10-24 MED ORDER — CETIRIZINE HCL 10 MG PO TABS: 10.0000 mg | ORAL_TABLET | Freq: Every day | ORAL | Status: AC

## 2014-10-24 MED ORDER — BENZONATATE 100 MG PO CAPS
100.0000 mg | ORAL_CAPSULE | Freq: Three times a day (TID) | ORAL | Status: AC | PRN
Start: 2014-10-24 — End: ?

## 2014-10-24 MED ORDER — FLUTICASONE PROPIONATE 50 MCG/ACT NA SUSP: 2.0000 | Freq: Every day | NASAL | Status: AC

## 2014-10-24 NOTE — Patient Instructions (Addendum)
I don't think your chest pain is due to your heart. Your EKG looks normal and your heart sounds are normal. I think your chest pain is from irritated muscles from the coughing. I think your coughing is due to the allergies you've been having.  Please take the zyrtec once daily and use the flonase nasal spray once daily, 2 sprays into each nostril.  Please take the tessalon up to three times daily (every 8 hours) as needed for your cough.  If your chest pain or cough are not improved in 2 weeks, please come back to clinic and we'll discuss further workup at that time including a chest xray.  Your elbow looks ok today. Please try to ice it when you can and take tylenol for the pain. If this does not improve the elbow pain please come back to clinic in 2 weeks.

## 2014-10-24 NOTE — Progress Notes (Signed)
Subjective:    Patient ID: Debra Reid, female    DOB: 02/24/1961, 53 y.o.   MRN: [id number]  PCP: Tonye PearsonOLITTLE, ROBERT P, MD  Chief Complaint  Patient presents with  . Chest Pain    for a couple weeks  . Elbow Pain    left elbow   Prior to Admission medications   Medication Sig Start Date End Date Taking? Authorizing Provider  Estradiol (DIVIGEL) 1 MG/GM GEL Place 1 application onto the skin daily.    Yes Historical Provider, MD  OVER THE COUNTER MEDICATION herbalife calcium/multivitamin supplement   Yes Historical Provider, MD  benzonatate (TESSALON) 100 MG capsule Take 1-2 capsules (100-200 mg total) by mouth 3 (three) times daily as needed for cough. 10/24/14   Raelyn Ensignodd Jobeth Pangilinan, PA  cetirizine (ZYRTEC) 10 MG tablet Take 1 tablet (10 mg total) by mouth daily. 10/24/14   Charles Andringa, PA  fluticasone (FLONASE) 50 MCG/ACT nasal spray Place 2 sprays into both nostrils daily. 10/24/14   Raelyn Ensignodd Twanisha Foulk, PA   Medications, allergies, past medical history, surgical history, family history, social history and problem list reviewed and updated.  HPI  [age] yof with no sig PMH presents for chest pain, head congestion, and left elbow pain.   CP - Has been present for past month. Constant 8/10, described as pain. Can move across her chest but mostly located over sternum. Can't recall the specific onset. CP does not worsen with exertion. Denies presyncope, syncope, palps, or SOB. No hx cad, htn, high chol, dm. As below, she has been coughing the past month and thinks the CP may be assoc with this.   Head congestion - Approx one month. She has hx allergies since moving to [place] 10 yrs ago. Was diagnosed with allergies by physician several yrs back. She only uses nasal saline spray for sx. For past month has had head congestion, rhinorrhea, non-prod cough that is worse at night. Feels like she is coughing due to drip down the back of her throat. Denies ear pain, abd pain, N/V, diarrhea. Has never taken  anti-histamine. Has been taking mucinex which hasn't provided any relief.   Left elbow pain - Present for past couple wks. Located at tip of olecranon. TTP if she touches it. Has been constant rated about 5/10. No radiation. No aggravating or relieving factors.   Review of Systems No night sweats, unintentional wt loss.     Objective:   Physical Exam  Constitutional: She is oriented to person, place, and time. She appears well-developed and well-nourished.  Non-toxic appearance. She does not have a sickly appearance. She does not appear ill. No distress.  BP 122/82 mmHg  Pulse 74  Temp(Src) 97.8 F (36.6 C) (Oral)  Resp 18  Ht 5' 6.5" (1.689 m)  Wt 191 lb 9.6 oz (86.909 kg)  BMI 30.47 kg/m2  SpO2 98%   HENT:  Right Ear: Tympanic membrane normal.  Left Ear: Tympanic membrane normal.  Nose: Nose normal. No mucosal edema or rhinorrhea. Right sinus exhibits no maxillary sinus tenderness and no frontal sinus tenderness. Left sinus exhibits no maxillary sinus tenderness and no frontal sinus tenderness.  Mouth/Throat: Oropharynx is clear and moist and mucous membranes are normal. No oropharyngeal exudate, posterior oropharyngeal edema or posterior oropharyngeal erythema.  Cardiovascular: Normal rate, regular rhythm, S1 normal, S2 normal and normal heart sounds.  Exam reveals no gallop.   No murmur heard. No lower extremity edema bilaterally.   Pulmonary/Chest: Effort normal and breath sounds normal. No respiratory  distress. She has no decreased breath sounds. She has no wheezes. She has no rhonchi. She has no rales. She exhibits tenderness.    TTP right lateral of sternum.   Musculoskeletal:       Left elbow: She exhibits normal range of motion, no swelling, no effusion, no deformity and no laceration. Tenderness found. Olecranon process tenderness noted. No radial head, no medial epicondyle and no lateral epicondyle tenderness noted.  TTP over olecranon. No swelling. No erythema. No  warmth. Full ROM. Normal strength at elbow and wrist, normal sensation left UE. 2+ radial pulse left.   Lymphadenopathy:       Head (right side): No submental, no submandibular and no tonsillar adenopathy present.       Head (left side): No submental, no submandibular and no tonsillar adenopathy present.    She has no cervical adenopathy.  Neurological: She is alert and oriented to person, place, and time.   EKG read by Dr. Katrinka BlazingSmith: Findings: Normal     Assessment & Plan:   [age] yof with no sig PMH presents for chest pain, head congestion, and left elbow pain.   Chest pain, unspecified chest pain type - Plan: EKG 12-Lead --Low concern for cardiac as has been present and unchanged for one month --Normal ekg, cardiac exam --Likely due to muscle strain/irritation from coughing past month --aleve or tylenol --rtc if worsens or not improving  Allergic rhinitis, unspecified allergic rhinitis type - Plan: fluticasone (FLONASE) 50 MCG/ACT nasal spray, cetirizine (ZYRTEC) 10 MG tablet Cough - Plan: benzonatate (TESSALON) 100 MG capsule --cough, rhinorrhea, congestion most likely due to allergic rhinitis --flonase, zyrtec, tessalon --continue mucinex --if cough not resolving in 2 wks, rtc for further workup poss chest xray  Left elbow pain --Benign exam, neurovascular intact --Poss bursitis as pain centered over olecranon --aleve, wrap, ice when possible, avoid direct pressure on hard surfaces --rtc 3 weeks if not improving  Donnajean Lopesodd M. Kwesi Sangha, PA-C Physician Assistant-Certified [facility]  10/24/2014 1:37 PM

## 2014-11-02 NOTE — Progress Notes (Signed)
History and physical examinations reviewed in detail with Raelyn Ensignodd McVeigh, PA-C.  EKG reviewed and WNL.  Agree with assessment and plan.

## 2014-11-19 ENCOUNTER — Ambulatory Visit (INDEPENDENT_AMBULATORY_CARE_PROVIDER_SITE_OTHER): Payer: Managed Care, Other (non HMO) | Admitting: Family Medicine

## 2014-11-19 ENCOUNTER — Ambulatory Visit (INDEPENDENT_AMBULATORY_CARE_PROVIDER_SITE_OTHER): Payer: Managed Care, Other (non HMO)

## 2014-11-19 VITALS — BP 122/70 | HR 82 | Temp 98.1°F | Resp 18 | Ht 66.0 in | Wt 195.2 lb

## 2014-11-19 DIAGNOSIS — Z131 Encounter for screening for diabetes mellitus: Secondary | ICD-10-CM

## 2014-11-19 DIAGNOSIS — R062 Wheezing: Secondary | ICD-10-CM

## 2014-11-19 DIAGNOSIS — R059 Cough, unspecified: Secondary | ICD-10-CM

## 2014-11-19 DIAGNOSIS — J302 Other seasonal allergic rhinitis: Secondary | ICD-10-CM

## 2014-11-19 DIAGNOSIS — R05 Cough: Secondary | ICD-10-CM

## 2014-11-19 LAB — POCT CBC
Granulocyte percent: 53.5 %G (ref 37–80)
HCT, POC: 44.5 % (ref 37.7–47.9)
Hemoglobin: 14.7 g/dL (ref 12.2–16.2)
LYMPH, POC: 2.7 (ref 0.6–3.4)
MCH, POC: 30.9 pg (ref 27–31.2)
MCHC: 33 g/dL (ref 31.8–35.4)
MCV: 93.7 fL (ref 80–97)
MID (cbc): 0.3 (ref 0–0.9)
MPV: 7.5 fL (ref 0–99.8)
POC Granulocyte: 3.4 (ref 2–6.9)
POC LYMPH %: 41.6 % (ref 10–50)
POC MID %: 4.9 %M (ref 0–12)
Platelet Count, POC: 258 10*3/uL (ref 142–424)
RBC: 4.75 M/uL (ref 4.04–5.48)
RDW, POC: 13.3 %
WBC: 6.4 10*3/uL (ref 4.6–10.2)

## 2014-11-19 LAB — GLUCOSE, POCT (MANUAL RESULT ENTRY): POC GLUCOSE: 95 mg/dL (ref 70–99)

## 2014-11-19 MED ORDER — GUAIFENESIN-CODEINE 100-10 MG/5ML PO SOLN: 5.0000 mL | Freq: Four times a day (QID) | ORAL | Status: AC | PRN

## 2014-11-19 MED ORDER — ALBUTEROL SULFATE (2.5 MG/3ML) 0.083% IN NEBU
2.5000 mg | INHALATION_SOLUTION | Freq: Once | RESPIRATORY_TRACT | Status: AC
  Administered 2014-11-19: 2.5 mg via RESPIRATORY_TRACT

## 2014-11-19 MED ORDER — METHYLPREDNISOLONE ACETATE 80 MG/ML IJ SUSP
80.0000 mg | Freq: Once | INTRAMUSCULAR | Status: AC
Start: 2014-11-19 — End: 2014-11-19
  Administered 2014-11-19: 80 mg via INTRAMUSCULAR

## 2014-11-19 MED ORDER — ALBUTEROL SULFATE HFA 108 (90 BASE) MCG/ACT IN AERS
2.0000 | INHALATION_SPRAY | Freq: Four times a day (QID) | RESPIRATORY_TRACT | Status: AC | PRN
Start: 2014-11-19 — End: ?

## 2014-11-19 MED ORDER — PREDNISONE 20 MG PO TABS: ORAL_TABLET | ORAL | Status: AC

## 2014-11-19 NOTE — Patient Instructions (Signed)
Bronchospasm A bronchospasm is when the tubes that carry air in and out of your lungs (airways) spasm or tighten. During a bronchospasm it is hard to breathe. This is because the airways get smaller. A bronchospasm can be triggered by:  Allergies. These may be to animals, pollen, food, or mold.  Infection. This is a common cause of bronchospasm.  Exercise.  Irritants. These include pollution, cigarette smoke, strong odors, aerosol sprays, and paint fumes.  Weather changes.  Stress.  Being emotional. HOME CARE   Always have a plan for getting help. Know when to call your doctor and local emergency services (911 in the [place] Know where you can get emergency care.  Only take medicines as told by your doctor.  If you were prescribed an inhaler or nebulizer machine, ask your doctor how to use it correctly. Always use a spacer with your inhaler if you were given one.  Stay calm during an attack. Try to relax and breathe more slowly.  Control your home environment:  Change your heating and air conditioning filter at least once a month.  Limit your use of fireplaces and wood stoves.  Do not  smoke. Do not  allow smoking in your home.  Avoid perfumes and fragrances.  Get rid of pests (such as roaches and mice) and their droppings.  Throw away plants if you see mold on them.  Keep your house clean and dust free.  Replace carpet with wood, tile, or vinyl flooring. Carpet can trap dander and dust.  Use allergy-proof pillows, mattress covers, and box spring covers.  Wash bed sheets and blankets every week in hot water. Dry them in a dryer.  Use blankets that are made of polyester or cotton.  Wash hands frequently. GET HELP IF:  You have muscle aches.  You have chest pain.  The thick spit you spit or cough up (sputum) changes from clear or white to yellow, green, gray, or bloody.  The thick spit you spit or cough up gets thicker.  There are problems that may be related  to the medicine you are given such as:  A rash.  Itching.  Swelling.  Trouble breathing. GET HELP RIGHT AWAY IF:  You feel you cannot breathe or catch your breath.  You cannot stop coughing.  Your treatment is not helping you breathe better.  You have very bad chest pain. MAKE SURE YOU:   Understand these instructions.  Will watch your condition.  Will get help right away if you are not doing well or get worse. Document Released: 09/03/2009 Document Revised: 11/11/2013 Document Reviewed: 04/29/2013 [employer] Patient Information 2015 [employer]. This information is not intended to replace advice given to you by your health care provider. Make sure you discuss any questions you have with your health care provider.  

## 2014-11-19 NOTE — Progress Notes (Signed)
Subjective:    Patient ID: Debra Reid, female    DOB: 11/17/1961, 53 y.o.   MRN: [id number]  11/19/2014  Follow-up; Cough; and Wheezing   HPI This 53 y.o. female presents for evaluation of persistent cough, wheezing.  Evaluated 10/24/2014 by Raelyn Ensignodd McVeigh. Diagnosed with allergic rhinitis; prescribed Zyrtec, Flonase, Tessalon Perles.  Since last visit, did not improve and developed chest congestion and wheezing.    On job, has [employer] who diagnosed over the phone with sinusitis; called in Zpack.  No better with Zithromax.  Worried about pneumonia.    No fever but +chills; +sweats.  Fatigued.  +headache.  +excessive coughing.  +sputum production.  No ear pain or sore throat.  +rhinorrhea; clear; +nasal congestion; +PND.    Itching and coughing in chest.  No SOB. +sputum yellow.  +wheezing; no issues with wheezing ever.  No animals.    No other medications.  Was trying Mucinex, Al[place]elter prior to last visit.     Review of Systems  Constitutional: Negative for fever, chills, diaphoresis and fatigue.  HENT: Positive for congestion, postnasal drip, rhinorrhea and voice change. Negative for ear pain, sinus pressure, sneezing, sore throat and trouble swallowing.   Respiratory: Positive for cough, shortness of breath and wheezing.   Cardiovascular: Positive for chest pain. Negative for palpitations and leg swelling.  Gastrointestinal: Positive for nausea. Negative for vomiting and diarrhea.  Skin: Negative for rash.  Neurological: Positive for headaches.    Past Medical History  Diagnosis Date  . Colitis     dx 08/2012  . Allergy   . Arthritis   . Numbness     both feet and hands  . Anxiety    Past Surgical History  Procedure Laterality Date  . Abdominal hysterectomy     No Known Allergies Current Outpatient Prescriptions  Medication Sig Dispense Refill  . cetirizine (ZYRTEC) 10 MG tablet Take 1 tablet (10 mg total) by mouth daily. 30 tablet 11  . Estradiol (DIVIGEL) 1  MG/GM GEL Place 1 application onto the skin daily.     . fluticasone (FLONASE) 50 MCG/ACT nasal spray Place 2 sprays into both nostrils daily. 16 g 12  . OVER THE COUNTER MEDICATION herbalife calcium/multivitamin supplement    . albuterol (PROVENTIL HFA;VENTOLIN HFA) 108 (90 BASE) MCG/ACT inhaler Inhale 2 puffs into the lungs every 6 (six) hours as needed for wheezing or shortness of breath. 1 Inhaler 2  . benzonatate (TESSALON) 100 MG capsule Take 1-2 capsules (100-200 mg total) by mouth 3 (three) times daily as needed for cough. (Patient not taking: Reported on 11/19/2014) 40 capsule 0  . predniSONE (DELTASONE) 20 MG tablet Three tablets daily x 2 days then two tablets daily x 5 days then one tablet daily x 3 days 19 tablet 0   No current facility-administered medications for this visit.       Objective:    BP 122/70 mmHg  Pulse 82  Temp(Src) 98.1 F (36.7 C) (Oral)  Resp 18  Ht 5\' 6"  (1.676 m)  Wt 195 lb 3.2 oz (88.542 kg)  BMI 31.52 kg/m2  SpO2 98% Physical Exam  Constitutional: She is oriented to person, place, and time. She appears well-developed and well-nourished. No distress.  HENT:  Head: Normocephalic and atraumatic.  Right Ear: External ear normal.  Left Ear: External ear normal.  Nose: Mucosal edema and rhinorrhea present. Right sinus exhibits no maxillary sinus tenderness and no frontal sinus tenderness. Left sinus exhibits no maxillary sinus tenderness and no  frontal sinus tenderness.  Mouth/Throat: Oropharynx is clear and moist. No oropharyngeal exudate.  Eyes: Conjunctivae are normal. Pupils are equal, round, and reactive to light.  Neck: Normal range of motion. Neck supple. No JVD present. No tracheal deviation present. No thyromegaly present.  Cardiovascular: Normal rate, regular rhythm and normal heart sounds.  Exam reveals no gallop and no friction rub.   No murmur heard. Pulmonary/Chest: Effort normal. No stridor. No respiratory distress. She has wheezes in the  right middle field and the left middle field. She has rhonchi in the right middle field and the left middle field. She has no rales.  Lymphadenopathy:    She has no cervical adenopathy.  Neurological: She is alert and oriented to person, place, and time.  Skin: No rash noted. She is not diaphoretic.  Psychiatric: She has a normal mood and affect. Her behavior is normal.  Nursing note and vitals reviewed.  ALBUTEROL NEBULIZER ADMINISTERED.  DEPOMEDROL 80MG  IM ADMINISTERED.  [facility] reading (PRIMARY) by  Dr. Katrinka BlazingSmith.  CXR: NAD       Assessment & Plan:   1. Cough   2. Wheezing   3. Screening for diabetes mellitus   4. Other seasonal allergic rhinitis      1.  Bronchospasm:  New.  S/p Depomedrol IM and Albuterol nebulizer in office; rx for Prednisone and Albuterol to use scheduled qid for next week and then PRN.  If wheezing persists or is intermittent, will warrant spirometry to evaluate for underlying asthma.   2.  Cough:  Worsening; secondary to bronchospasm.  S/p Albuterol nebulizer in office. 3.  Allergic Rhinitis:  Persistent; continue Zyrtec 10mg  daily and Flonase daily. 4.  Screening DMII: glucose normal.    Meds ordered this encounter  Medications  . albuterol (PROVENTIL) (2.5 MG/3ML) 0.083% nebulizer solution 2.5 mg    Sig:   . methylPREDNISolone acetate (DEPO-MEDROL) injection 80 mg    Sig:   . predniSONE (DELTASONE) 20 MG tablet    Sig: Three tablets daily x 2 days then two tablets daily x 5 days then one tablet daily x 3 days    Dispense:  19 tablet    Refill:  0  . albuterol (PROVENTIL HFA;VENTOLIN HFA) 108 (90 BASE) MCG/ACT inhaler    Sig: Inhale 2 puffs into the lungs every 6 (six) hours as needed for wheezing or shortness of breath.    Dispense:  1 Inhaler    Refill:  2    No Follow-up on file.    Debra Reid, M.D. [facility]  [place] [address] [place], [place]  [id number] [phone] phone [phone] fax

## 2015-02-10 ENCOUNTER — Telehealth: Payer: Self-pay

## 2015-02-10 NOTE — Telephone Encounter (Signed)
Patient says she is experiencing a tingling in her throat again. She wants the same medications from 11/19/14 called in. She does not want to come in

## 2015-02-11 NOTE — Telephone Encounter (Signed)
Called pt, mobile number not in service. Called home number left message to call back.

## 2015-02-12 NOTE — Telephone Encounter (Signed)
Left detailed msg on machine for pt to rtc for evaluation

## 2015-03-15 ENCOUNTER — Emergency Department (HOSPITAL_COMMUNITY)
Admission: EM | Admit: 2015-03-15 | Discharge: 2015-03-16 | Disposition: A | Discharge status: Home / Self Care | Payer: Managed Care, Other (non HMO) | Attending: Emergency Medicine | Admitting: Emergency Medicine

## 2015-03-15 ENCOUNTER — Encounter (HOSPITAL_COMMUNITY): Payer: Self-pay | Admitting: Emergency Medicine

## 2015-03-15 DIAGNOSIS — M1990 Unspecified osteoarthritis, unspecified site: Secondary | ICD-10-CM | POA: Diagnosis not present

## 2015-03-15 DIAGNOSIS — R200 Anesthesia of skin: Secondary | ICD-10-CM | POA: Diagnosis present

## 2015-03-15 DIAGNOSIS — R202 Paresthesia of skin: Secondary | ICD-10-CM | POA: Diagnosis not present

## 2015-03-15 DIAGNOSIS — Z8719 Personal history of other diseases of the digestive system: Secondary | ICD-10-CM | POA: Insufficient documentation

## 2015-03-15 DIAGNOSIS — Z793 Long term (current) use of hormonal contraceptives: Secondary | ICD-10-CM | POA: Insufficient documentation

## 2015-03-15 DIAGNOSIS — Z8659 Personal history of other mental and behavioral disorders: Secondary | ICD-10-CM | POA: Insufficient documentation

## 2015-03-15 DIAGNOSIS — Z79899 Other long term (current) drug therapy: Secondary | ICD-10-CM | POA: Insufficient documentation

## 2015-03-15 DIAGNOSIS — Z7951 Long term (current) use of inhaled steroids: Secondary | ICD-10-CM | POA: Insufficient documentation

## 2015-03-15 HISTORY — DX: Sciatica, unspecified side: M54.30

## 2015-03-15 HISTORY — DX: Other cervical disc degeneration, unspecified cervical region: M50.30

## 2015-03-15 LAB — URINALYSIS, ROUTINE W REFLEX MICROSCOPIC
BILIRUBIN URINE: NEGATIVE
GLUCOSE, UA: NEGATIVE mg/dL
Hgb urine dipstick: NEGATIVE
Ketones, ur: NEGATIVE mg/dL
NITRITE: NEGATIVE
PROTEIN: NEGATIVE mg/dL
Specific Gravity, Urine: 1.007 (ref 1.005–1.030)
Urobilinogen, UA: 0.2 mg/dL (ref 0.0–1.0)
pH: 6.5 (ref 5.0–8.0)

## 2015-03-15 LAB — CBC WITH DIFFERENTIAL/PLATELET
BASOS ABS: 0 10*3/uL (ref 0.0–0.1)
Basophils Relative: 0 % (ref 0–1)
EOS ABS: 0.2 10*3/uL (ref 0.0–0.7)
EOS PCT: 3 % (ref 0–5)
HCT: 38.5 % (ref 36.0–46.0)
Hemoglobin: 13.5 g/dL (ref 12.0–15.0)
LYMPHS PCT: 38 % (ref 12–46)
Lymphs Abs: 2 10*3/uL (ref 0.7–4.0)
MCH: 32.1 pg (ref 26.0–34.0)
MCHC: 35.1 g/dL (ref 30.0–36.0)
MCV: 91.7 fL (ref 78.0–100.0)
Monocytes Absolute: 0.3 10*3/uL (ref 0.1–1.0)
Monocytes Relative: 6 % (ref 3–12)
NEUTROS PCT: 53 % (ref 43–77)
Neutro Abs: 2.8 10*3/uL (ref 1.7–7.7)
Platelets: 243 10*3/uL (ref 150–400)
RBC: 4.2 MIL/uL (ref 3.87–5.11)
RDW: 12.6 % (ref 11.5–15.5)
WBC: 5.3 10*3/uL (ref 4.0–10.5)

## 2015-03-15 LAB — COMPREHENSIVE METABOLIC PANEL
ALT: 17 U/L (ref 0–35)
AST: 26 U/L (ref 0–37)
Albumin: 3.7 g/dL (ref 3.5–5.2)
Alkaline Phosphatase: 70 U/L (ref 39–117)
Anion gap: 6 (ref 5–15)
BUN: 14 mg/dL (ref 6–23)
CHLORIDE: 105 mmol/L (ref 96–112)
CO2: 26 mmol/L (ref 19–32)
Calcium: 8.6 mg/dL (ref 8.4–10.5)
Creatinine, Ser: 0.73 mg/dL (ref 0.50–1.10)
GFR calc Af Amer: >90 mL/min (ref >90)
GFR calc non Af Amer: >90 mL/min (ref >90)
Glucose, Bld: 105 mg/dL — ABNORMAL HIGH (ref 70–99)
Potassium: 4.2 mmol/L (ref 3.5–5.1)
SODIUM: 137 mmol/L (ref 135–145)
TOTAL PROTEIN: 6.7 g/dL (ref 6.0–8.3)
Total Bilirubin: 0.2 mg/dL — ABNORMAL LOW (ref 0.3–1.2)

## 2015-03-15 LAB — URINE MICROSCOPIC-ADD ON

## 2015-03-15 NOTE — Discharge Instructions (Signed)

## 2015-03-15 NOTE — ED Notes (Signed)
Pt states she is having numbness all over her body  Pt states this episode started on Thursday  Pt states she had the same thing a while back and saw a neurologist and had testing but they could not find out what was wrong  Pt states she is unable to lay on her left shoulder  Pt states she works at [employer] and does not know if she did something lifting or what

## 2015-03-15 NOTE — ED Provider Notes (Signed)
CSN: [id number]     Arrival date & time 03/15/15  1956 History   First MD Initiated Contact with Patient 03/15/15 2200     Chief Complaint  Patient presents with  . Numbness     (Consider location/radiation/quality/duration/timing/severity/associated sxs/prior Treatment) HPI The patient describes problems with numbness as been coming and going for approximately a year. Review of her electronic medical record indicates that she was seen by neurology year ago in that she was to follow-up if her symptoms do not completely resolve. The patient states that it never really went away completely and would come and go in terms of severity. She states that things got worse however about 3 days ago. She states she has a numb feeling of both hands as well as both feet and legs. She however does not have any weakness. She denies any difficulty walking. She denies that she's been dropping things or unable to grip items appropriately. She denies any bowel or bladder incontinence or stool or urine retention. She has not had any fever or general illness. Past Medical History  Diagnosis Date  . Colitis     dx 08/2012  . Allergy   . Arthritis   . Numbness     both feet and hands  . Anxiety   . Sciatica   . DDD (degenerative disc disease), cervical    Past Surgical History  Procedure Laterality Date  . Abdominal hysterectomy     Family History  Problem Relation Age of Onset  . Diabetes Mother   . Hypertension Mother    History  Substance Use Topics  . Smoking status: Never Smoker   . Smokeless tobacco: Never Used  . Alcohol Use: No   OB History    No data available     Review of Systems  10 Systems reviewed and are negative for acute change except as noted in the HPI.   Allergies  Review of patient's allergies indicates no known allergies.  Home Medications   Prior to Admission medications   Medication Sig Start Date End Date Taking? Authorizing Provider  cetirizine (ZYRTEC) 10 MG  tablet Take 1 tablet (10 mg total) by mouth daily. 10/24/14  Yes Huey Bienenstockodd M McVeigh, PA  Cholecalciferol (VITAMIN D-3 PO) Take 5,000 Units by mouth daily.   Yes Historical Provider, MD  Cyanocobalamin (VITAMIN B-12) 1000 MCG SUBL Place 1 tablet under the tongue daily.   Yes Historical Provider, MD  esomeprazole (NEXIUM) 40 MG capsule Take 40 mg by mouth daily at 12 noon.   Yes Historical Provider, MD  Estradiol (DIVIGEL) 1 MG/GM GEL Place 1 application onto the skin daily.    Yes Historical Provider, MD  magnesium 30 MG tablet Take 30 mg by mouth daily.   Yes Historical Provider, MD  OVER THE COUNTER MEDICATION herbalife calcium/multivitamin supplement   Yes Historical Provider, MD  sodium chloride (OCEAN) 0.65 % SOLN nasal spray Place 1 spray into both nostrils daily.   Yes Historical Provider, MD  albuterol (PROVENTIL HFA;VENTOLIN HFA) 108 (90 BASE) MCG/ACT inhaler Inhale 2 puffs into the lungs every 6 (six) hours as needed for wheezing or shortness of breath. 11/19/14   Ethelda ChickKristi M Smith, MD  benzonatate (TESSALON) 100 MG capsule Take 1-2 capsules (100-200 mg total) by mouth 3 (three) times daily as needed for cough. Patient not taking: Reported on 11/19/2014 10/24/14   Huey Bienenstockodd M McVeigh, PA  fluticasone [facility](FLONASE) 50 MCG/ACT nasal spray Place 2 sprays into both nostrils daily. Patient not taking: Reported on  03/15/2015 10/24/14   Huey Bienenstock McVeigh, PA  guaiFENesin-codeine 100-10 MG/5ML syrup Take 5 mLs by mouth every 6 (six) hours as needed for cough. Patient not taking: Reported on 03/15/2015 11/19/14   Ethelda Chick, MD  predniSONE (DELTASONE) 20 MG tablet Three tablets daily x 2 days then two tablets daily x 5 days then one tablet daily x 3 days Patient not taking: Reported on 03/15/2015 11/19/14   Ethelda Chick, MD   BP 134/92 mmHg  Pulse 57  Temp(Src) 97.8 F (36.6 C) (Oral)  Resp 20  Ht  (1.676 m)  Wt 183 lb (83.008 kg)  BMI 29.55 kg/m2  SpO2 100% Physical Exam  Constitutional: She is  oriented to person, place, and time. She appears well-developed and well-nourished.  HENT:  Head: Normocephalic and atraumatic.  Eyes: EOM are normal. Pupils are equal, round, and reactive to light.  Neck: Neck supple.  Cardiovascular: Normal rate, regular rhythm, normal heart sounds and intact distal pulses.   Pulmonary/Chest: Effort normal and breath sounds normal.  Abdominal: Soft. Bowel sounds are normal. She exhibits no distension. There is no tenderness.  Musculoskeletal: Normal range of motion. She exhibits no edema.  Neurological: She is alert and oriented to person, place, and time. She has normal strength. No cranial nerve deficit. She exhibits normal muscle tone. Coordination normal. GCS eye subscore is 4. GCS verbal subscore is 5. GCS motor subscore is 6.  The patient has normal neurologic examination to light touch and strength testing. Cognitive examination is normal. Cerebellar examination normal.  Skin: Skin is warm, dry and intact.  Psychiatric: She has a normal mood and affect.    ED Course  Procedures (including critical care time) Labs Review Labs Reviewed  COMPREHENSIVE METABOLIC PANEL - Abnormal; Notable for the following:    Glucose, Bld 105 (*)    Total Bilirubin 0.2 (*)    All other components within normal limits  URINALYSIS, ROUTINE W REFLEX MICROSCOPIC - Abnormal; Notable for the following:    APPearance CLOUDY (*)    Leukocytes, UA SMALL (*)    All other components within normal limits  URINE MICROSCOPIC-ADD ON - Abnormal; Notable for the following:    Squamous Epithelial / LPF FEW (*)    Bacteria, UA FEW (*)    All other components within normal limits  URINE CULTURE  CBC WITH DIFFERENTIAL/PLATELET  SEDIMENTATION RATE    Imaging Review No results found.   EKG Interpretation   Date/Time:  Monday March 15 2015 20:47:25 EDT Ventricular Rate:  63 PR Interval:  149 QRS Duration: 92 QT Interval:  410 QTC Calculation: 420 R Axis:   24 Text  Interpretation:  Sinus rhythm Borderline T abnormalities, anterior  leads no stemi.  Confirmed by Donnald Garre, MD, Lebron Conners [id number]) on 03/16/2015  12:25:45 AM      MDM   Final diagnoses:  Paresthesia  Numbness   The patient has had ongoing waxing and waning sensation of numbness for approximately a year. At this time there is no focal deficit on the neurologic examination. The patient is not having gait disorder nor cognitive dysfunction. At this point the patient will be instructed to follow-up with her neurologist for further diagnostic evaluation.    Arby Barrette, MD 03/19/15 [phone]

## 2015-03-15 NOTE — ED Notes (Signed)
Patient states she is experiencing numbness in her hands and feet constantly for the last week.

## 2015-03-16 LAB — SEDIMENTATION RATE: Sed Rate: 16 mm/hr (ref 0–22)

## 2015-03-17 LAB — URINE CULTURE

## 2015-03-19 IMAGING — CT CT ABD-PELV W/ CM
2 of 5 series · 17 of 46 positions shown, 19 images · IV contrast (OMNIPAQUE)
Comparison: 09/01/2012.

CLINICAL DATA: Right lower quadrant pain

EXAM:
CT ABDOMEN AND PELVIS WITH CONTRAST
TECHNIQUE: Multidetector CT imaging of the abdomen and pelvis was performed
using the standard protocol following bolus administration of
intravenous contrast.
CONTRAST:  100mL OMNIPAQUE IOHEXOL 300 MG/ML  SOLN

[Series 2: rtn a/p with · axial · 0.74mm/px · z∈[-612,-192]mm · 14 of 94 slices shown, 16 images]
[im 5/94  soft-tissue]
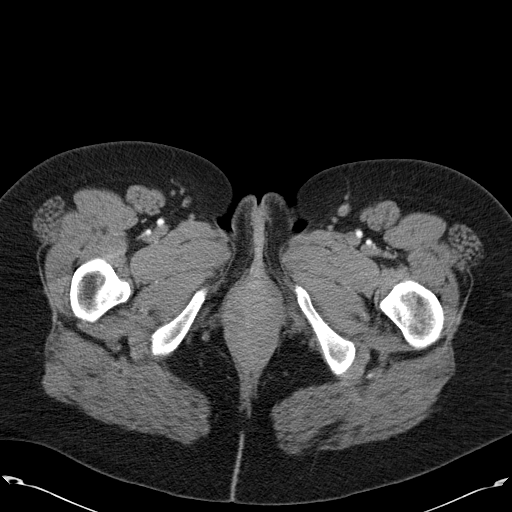
[im 5/94  bone]
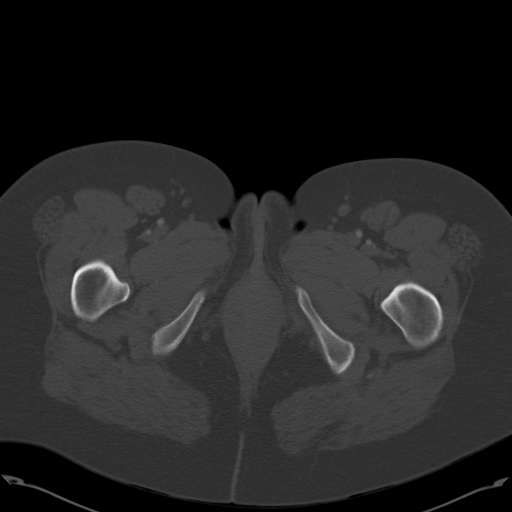
[im 14/94  soft-tissue]
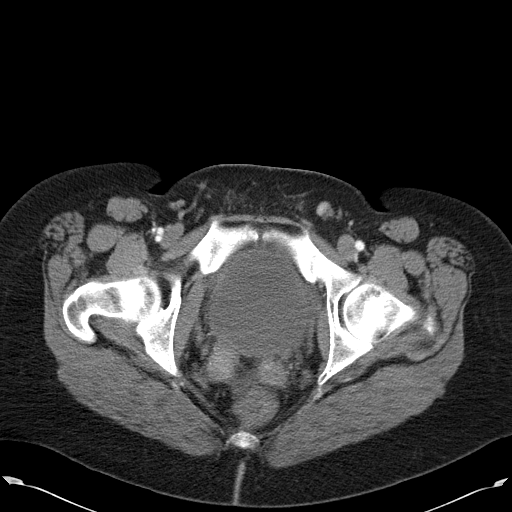
[im 19/94  soft-tissue]
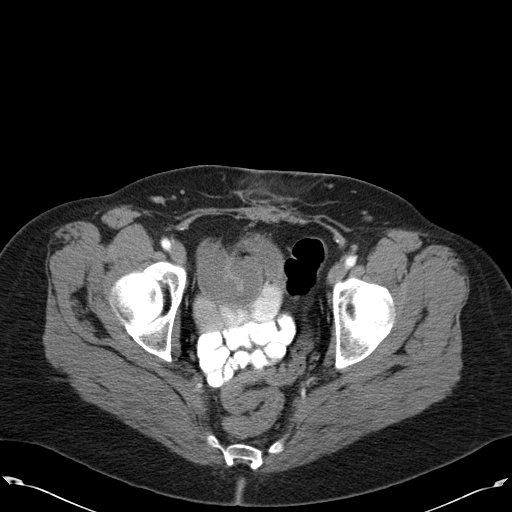
[im 24/94  soft-tissue]
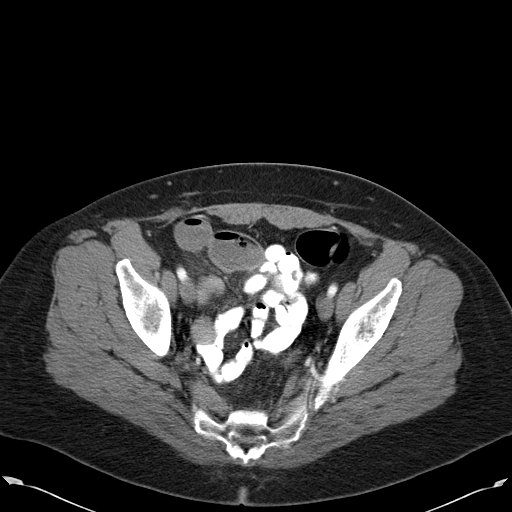
[im 33/94  soft-tissue]
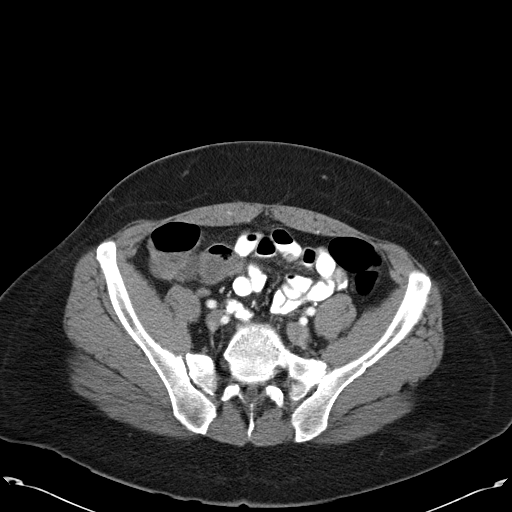
[im 38/94  soft-tissue]
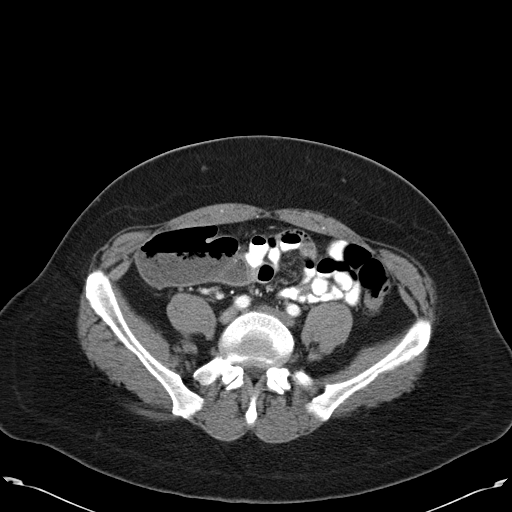
[im 42/94  soft-tissue]
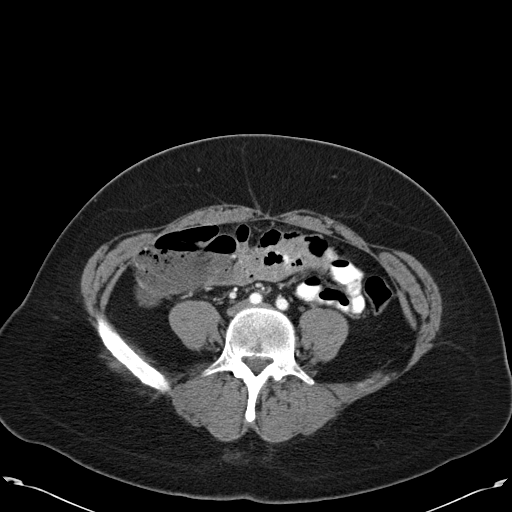
[im 52/94  soft-tissue]
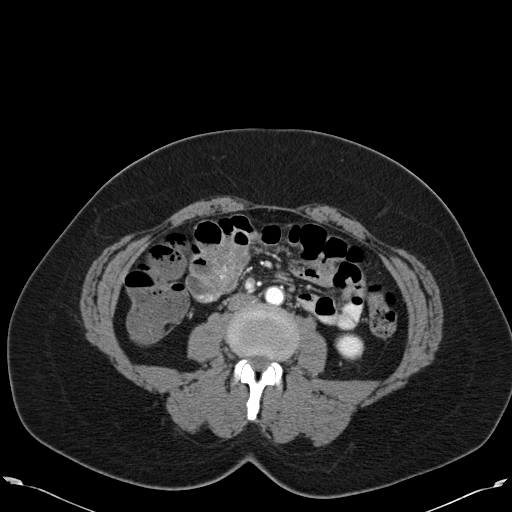
[im 56/94  soft-tissue]
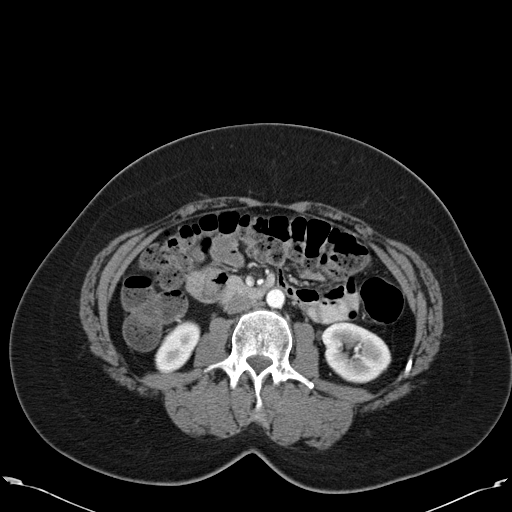
[im 56/94  bone]
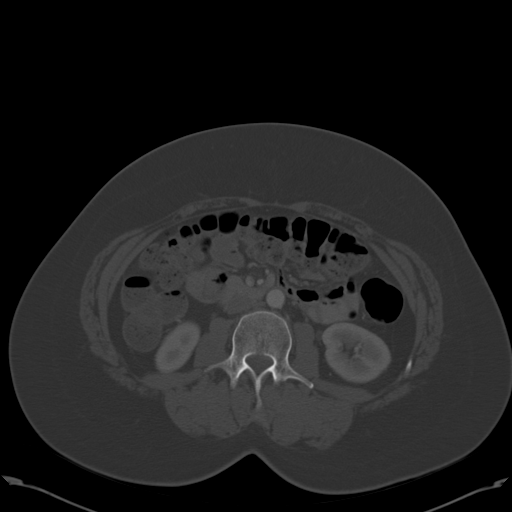
[im 61/94  soft-tissue]
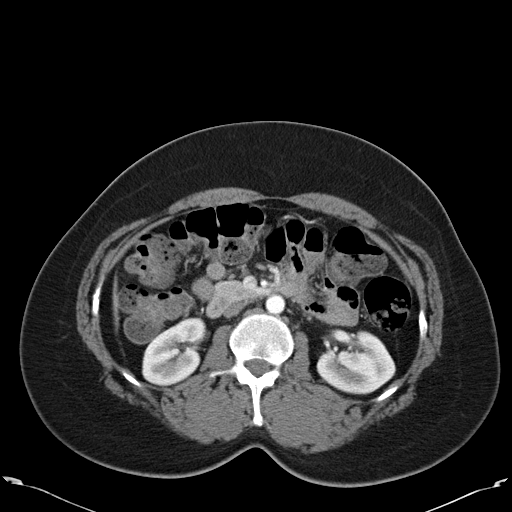
[im 70/94  soft-tissue]
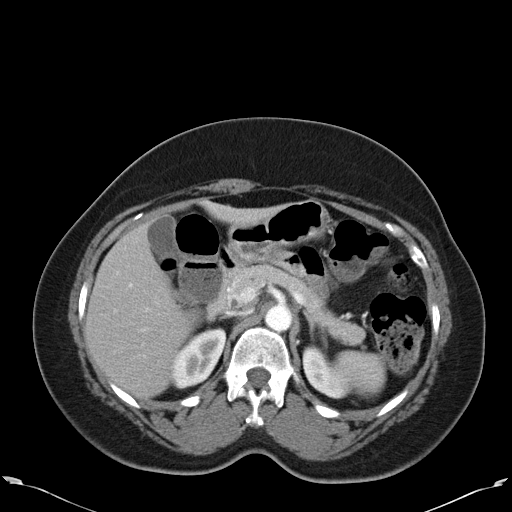
[im 75/94  soft-tissue]
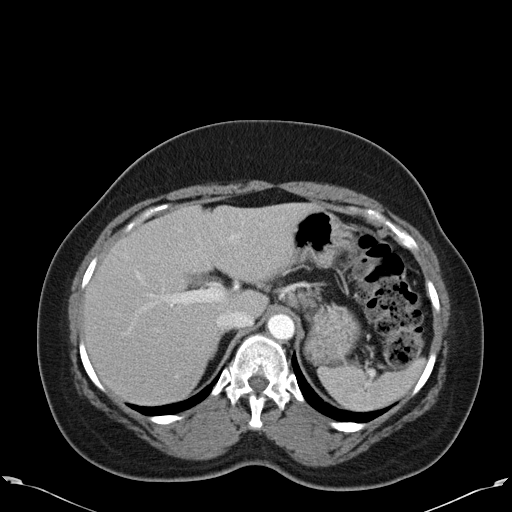
[im 80/94  soft-tissue]
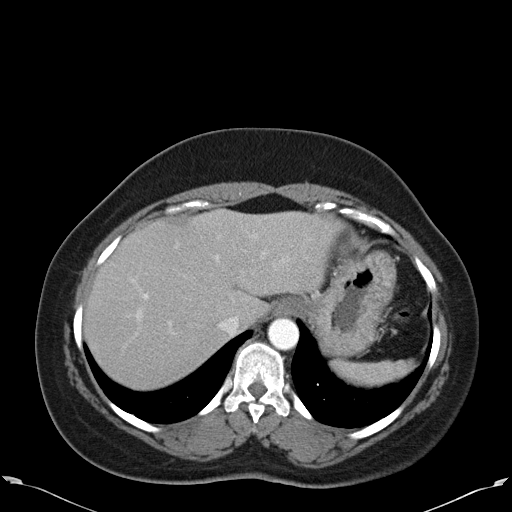
[im 89/94  soft-tissue]
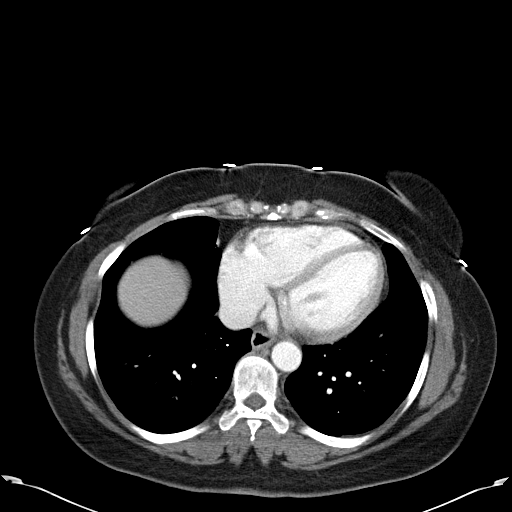

[Series 602: <mpr thick range> · coronal · 0.92mm/px · 3 of 78 slices shown]
[im 26/78  soft-tissue]
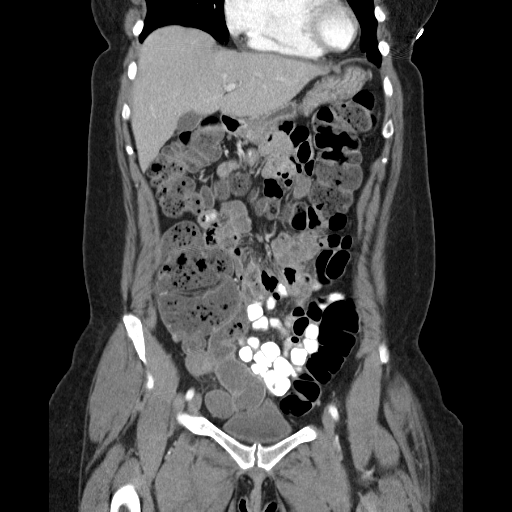
[im 35/78  soft-tissue]
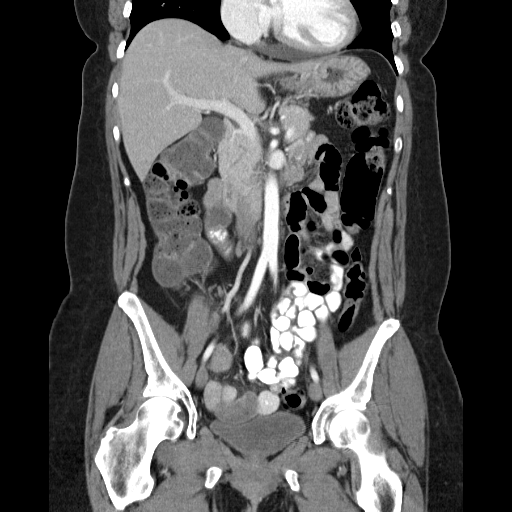
[im 43/78  soft-tissue]
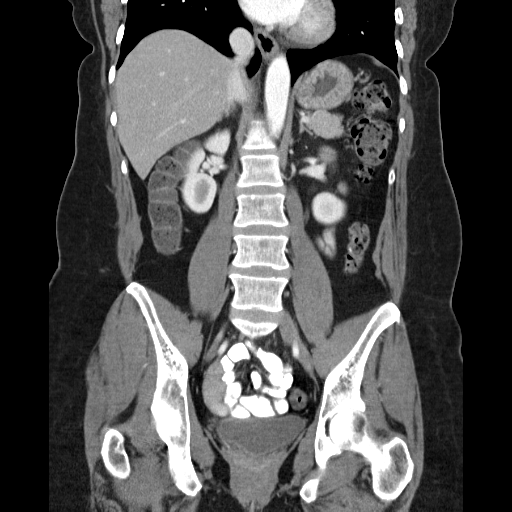

[17 of 46 positions shown; findings below may reference images not displayed]

FINDINGS: Lung bases are well aerated without focal infiltrate. On the first
image in the azygoesophageal recess, there is a vague soft tissue
density identified which is likely related to a branch of pulmonary
vein. This was present on prior exam from 09/01/2012.

The liver, gallbladder, spleen, adrenal glands and pancreas are
within normal limits. Kidneys are well visualized bilaterally. No
renal calculi or obstructive changes are seen. Delayed images
through the kidneys show normal enhancement bilaterally. The
appendix is within normal limits . No changes to suggest colitis are
identified. The uterus has been surgically removed. Minimal free
fluid is noted within the pelvis which is improved from the prior
exam and may be physiologic in nature.
IMPRESSION: No acute abnormality is identified.

## 2015-04-12 ENCOUNTER — Other Ambulatory Visit: Payer: Self-pay | Admitting: Obstetrics and Gynecology

## 2015-04-13 LAB — CYTOLOGY - PAP

## 2015-06-09 ENCOUNTER — Telehealth: Payer: Self-pay

## 2015-06-09 NOTE — Telephone Encounter (Signed)
Patient requesting a [facility] for [facility] pain. Her call back number is [phone]

## 2015-06-09 NOTE — Telephone Encounter (Signed)
Left message to RTC first for evaluation of shoulder pain.

## 2015-08-19 ENCOUNTER — Telehealth: Payer: Self-pay | Admitting: Family Medicine

## 2015-10-18 ENCOUNTER — Encounter: Payer: Self-pay | Admitting: Internal Medicine

## 2015-10-26 NOTE — Telephone Encounter (Signed)
ERROR

## 2017-06-14 ENCOUNTER — Other Ambulatory Visit: Payer: Self-pay | Admitting: Otolaryngology

## 2017-06-14 DIAGNOSIS — K219 Gastro-esophageal reflux disease without esophagitis: Secondary | ICD-10-CM

## 2017-06-14 DIAGNOSIS — R0989 Other specified symptoms and signs involving the circulatory and respiratory systems: Secondary | ICD-10-CM
